# Patient Record
Sex: Female | Born: 1945 | Hispanic: No | Smoking: Current some day smoker
Health system: Southern US, Community
[De-identification: ages and names within clinical notes are randomized; demographics above are authoritative.]

## PROBLEM LIST (undated history)

## (undated) DIAGNOSIS — I10 Essential (primary) hypertension: Secondary | ICD-10-CM

## (undated) DIAGNOSIS — E785 Hyperlipidemia, unspecified: Secondary | ICD-10-CM

## (undated) DIAGNOSIS — E119 Type 2 diabetes mellitus without complications: Secondary | ICD-10-CM

---

## 1999-05-26 ENCOUNTER — Encounter: Payer: Self-pay | Admitting: Family Medicine

## 1999-05-26 ENCOUNTER — Ambulatory Visit (HOSPITAL_COMMUNITY): Admission: RE | Admit: 1999-05-26 | Discharge: 1999-05-26 | Payer: Self-pay | Admitting: Family Medicine

## 1999-05-31 ENCOUNTER — Encounter: Payer: Self-pay | Admitting: Family Medicine

## 1999-05-31 ENCOUNTER — Ambulatory Visit (HOSPITAL_COMMUNITY): Admission: RE | Admit: 1999-05-31 | Discharge: 1999-05-31 | Payer: Self-pay | Admitting: Family Medicine

## 2012-02-19 DIAGNOSIS — I10 Essential (primary) hypertension: Secondary | ICD-10-CM | POA: Diagnosis not present

## 2012-02-19 DIAGNOSIS — E782 Mixed hyperlipidemia: Secondary | ICD-10-CM | POA: Diagnosis not present

## 2012-02-19 DIAGNOSIS — Z79899 Other long term (current) drug therapy: Secondary | ICD-10-CM | POA: Diagnosis not present

## 2012-02-19 DIAGNOSIS — E1129 Type 2 diabetes mellitus with other diabetic kidney complication: Secondary | ICD-10-CM | POA: Diagnosis not present

## 2012-07-28 DIAGNOSIS — Z23 Encounter for immunization: Secondary | ICD-10-CM | POA: Diagnosis not present

## 2012-09-28 DIAGNOSIS — Z79899 Other long term (current) drug therapy: Secondary | ICD-10-CM | POA: Diagnosis not present

## 2012-09-28 DIAGNOSIS — M159 Polyosteoarthritis, unspecified: Secondary | ICD-10-CM | POA: Diagnosis not present

## 2012-09-28 DIAGNOSIS — F17200 Nicotine dependence, unspecified, uncomplicated: Secondary | ICD-10-CM | POA: Diagnosis not present

## 2012-09-28 DIAGNOSIS — E119 Type 2 diabetes mellitus without complications: Secondary | ICD-10-CM | POA: Diagnosis not present

## 2013-04-07 DIAGNOSIS — E119 Type 2 diabetes mellitus without complications: Secondary | ICD-10-CM | POA: Diagnosis not present

## 2013-04-12 DIAGNOSIS — E119 Type 2 diabetes mellitus without complications: Secondary | ICD-10-CM | POA: Diagnosis not present

## 2013-04-13 DIAGNOSIS — Z006 Encounter for examination for normal comparison and control in clinical research program: Secondary | ICD-10-CM | POA: Diagnosis not present

## 2013-04-13 DIAGNOSIS — E1129 Type 2 diabetes mellitus with other diabetic kidney complication: Secondary | ICD-10-CM | POA: Diagnosis not present

## 2013-07-10 DIAGNOSIS — E119 Type 2 diabetes mellitus without complications: Secondary | ICD-10-CM | POA: Diagnosis not present

## 2013-07-10 DIAGNOSIS — Z79899 Other long term (current) drug therapy: Secondary | ICD-10-CM | POA: Diagnosis not present

## 2013-07-17 DIAGNOSIS — Z23 Encounter for immunization: Secondary | ICD-10-CM | POA: Diagnosis not present

## 2013-07-17 DIAGNOSIS — F17200 Nicotine dependence, unspecified, uncomplicated: Secondary | ICD-10-CM | POA: Diagnosis not present

## 2013-07-17 DIAGNOSIS — E119 Type 2 diabetes mellitus without complications: Secondary | ICD-10-CM | POA: Diagnosis not present

## 2014-01-11 DIAGNOSIS — Z79899 Other long term (current) drug therapy: Secondary | ICD-10-CM | POA: Diagnosis not present

## 2014-01-11 DIAGNOSIS — F17200 Nicotine dependence, unspecified, uncomplicated: Secondary | ICD-10-CM | POA: Diagnosis not present

## 2014-01-11 DIAGNOSIS — E119 Type 2 diabetes mellitus without complications: Secondary | ICD-10-CM | POA: Diagnosis not present

## 2014-01-11 DIAGNOSIS — E782 Mixed hyperlipidemia: Secondary | ICD-10-CM | POA: Diagnosis not present

## 2014-06-01 DIAGNOSIS — Z79899 Other long term (current) drug therapy: Secondary | ICD-10-CM | POA: Diagnosis not present

## 2014-06-01 DIAGNOSIS — Z0000 Encounter for general adult medical examination without abnormal findings: Secondary | ICD-10-CM | POA: Diagnosis not present

## 2014-06-01 DIAGNOSIS — E119 Type 2 diabetes mellitus without complications: Secondary | ICD-10-CM | POA: Diagnosis not present

## 2014-06-01 DIAGNOSIS — E782 Mixed hyperlipidemia: Secondary | ICD-10-CM | POA: Diagnosis not present

## 2014-06-01 DIAGNOSIS — F17200 Nicotine dependence, unspecified, uncomplicated: Secondary | ICD-10-CM | POA: Diagnosis not present

## 2014-06-01 DIAGNOSIS — Z760 Encounter for issue of repeat prescription: Secondary | ICD-10-CM | POA: Diagnosis not present

## 2014-07-27 DIAGNOSIS — Z23 Encounter for immunization: Secondary | ICD-10-CM | POA: Diagnosis not present

## 2015-03-28 DIAGNOSIS — N183 Chronic kidney disease, stage 3 (moderate): Secondary | ICD-10-CM | POA: Diagnosis not present

## 2015-03-28 DIAGNOSIS — Z6841 Body Mass Index (BMI) 40.0 and over, adult: Secondary | ICD-10-CM | POA: Diagnosis not present

## 2015-03-28 DIAGNOSIS — Z79899 Other long term (current) drug therapy: Secondary | ICD-10-CM | POA: Diagnosis not present

## 2015-03-28 DIAGNOSIS — Z23 Encounter for immunization: Secondary | ICD-10-CM | POA: Diagnosis not present

## 2015-03-28 DIAGNOSIS — E1122 Type 2 diabetes mellitus with diabetic chronic kidney disease: Secondary | ICD-10-CM | POA: Diagnosis not present

## 2015-03-28 DIAGNOSIS — M8580 Other specified disorders of bone density and structure, unspecified site: Secondary | ICD-10-CM | POA: Diagnosis not present

## 2015-03-28 DIAGNOSIS — Z794 Long term (current) use of insulin: Secondary | ICD-10-CM | POA: Diagnosis not present

## 2015-05-29 DIAGNOSIS — E161 Other hypoglycemia: Secondary | ICD-10-CM | POA: Diagnosis not present

## 2015-09-16 DIAGNOSIS — Z23 Encounter for immunization: Secondary | ICD-10-CM | POA: Diagnosis not present

## 2016-08-03 DIAGNOSIS — Z0000 Encounter for general adult medical examination without abnormal findings: Secondary | ICD-10-CM | POA: Diagnosis not present

## 2016-08-03 DIAGNOSIS — Z23 Encounter for immunization: Secondary | ICD-10-CM | POA: Diagnosis not present

## 2016-08-03 DIAGNOSIS — Z794 Long term (current) use of insulin: Secondary | ICD-10-CM | POA: Diagnosis not present

## 2016-08-03 DIAGNOSIS — D638 Anemia in other chronic diseases classified elsewhere: Secondary | ICD-10-CM | POA: Diagnosis not present

## 2016-08-03 DIAGNOSIS — E1122 Type 2 diabetes mellitus with diabetic chronic kidney disease: Secondary | ICD-10-CM | POA: Diagnosis not present

## 2016-08-03 DIAGNOSIS — N183 Chronic kidney disease, stage 3 (moderate): Secondary | ICD-10-CM | POA: Diagnosis not present

## 2017-08-14 DIAGNOSIS — Z23 Encounter for immunization: Secondary | ICD-10-CM | POA: Diagnosis not present

## 2017-11-17 DIAGNOSIS — Z0000 Encounter for general adult medical examination without abnormal findings: Secondary | ICD-10-CM | POA: Diagnosis not present

## 2017-11-17 DIAGNOSIS — N183 Chronic kidney disease, stage 3 (moderate): Secondary | ICD-10-CM | POA: Diagnosis not present

## 2017-11-17 DIAGNOSIS — E1122 Type 2 diabetes mellitus with diabetic chronic kidney disease: Secondary | ICD-10-CM | POA: Diagnosis not present

## 2017-11-17 DIAGNOSIS — Z794 Long term (current) use of insulin: Secondary | ICD-10-CM | POA: Diagnosis not present

## 2018-05-23 DIAGNOSIS — Z1331 Encounter for screening for depression: Secondary | ICD-10-CM | POA: Diagnosis not present

## 2018-05-23 DIAGNOSIS — Z794 Long term (current) use of insulin: Secondary | ICD-10-CM | POA: Diagnosis not present

## 2018-05-23 DIAGNOSIS — E1122 Type 2 diabetes mellitus with diabetic chronic kidney disease: Secondary | ICD-10-CM | POA: Diagnosis not present

## 2018-05-23 DIAGNOSIS — N183 Chronic kidney disease, stage 3 (moderate): Secondary | ICD-10-CM | POA: Diagnosis not present

## 2018-06-13 DIAGNOSIS — E1129 Type 2 diabetes mellitus with other diabetic kidney complication: Secondary | ICD-10-CM | POA: Diagnosis not present

## 2018-06-24 DIAGNOSIS — E1129 Type 2 diabetes mellitus with other diabetic kidney complication: Secondary | ICD-10-CM | POA: Diagnosis not present

## 2018-07-12 DIAGNOSIS — Z23 Encounter for immunization: Secondary | ICD-10-CM | POA: Diagnosis not present

## 2019-02-15 DIAGNOSIS — Z79899 Other long term (current) drug therapy: Secondary | ICD-10-CM | POA: Diagnosis not present

## 2019-02-15 DIAGNOSIS — Z6837 Body mass index (BMI) 37.0-37.9, adult: Secondary | ICD-10-CM | POA: Diagnosis not present

## 2019-02-15 DIAGNOSIS — Z0000 Encounter for general adult medical examination without abnormal findings: Secondary | ICD-10-CM | POA: Diagnosis not present

## 2019-02-15 DIAGNOSIS — Z9181 History of falling: Secondary | ICD-10-CM | POA: Diagnosis not present

## 2019-02-15 DIAGNOSIS — E1122 Type 2 diabetes mellitus with diabetic chronic kidney disease: Secondary | ICD-10-CM | POA: Diagnosis not present

## 2019-02-15 DIAGNOSIS — Z1331 Encounter for screening for depression: Secondary | ICD-10-CM | POA: Diagnosis not present

## 2019-02-15 DIAGNOSIS — E782 Mixed hyperlipidemia: Secondary | ICD-10-CM | POA: Diagnosis not present

## 2019-08-02 DIAGNOSIS — Z23 Encounter for immunization: Secondary | ICD-10-CM | POA: Diagnosis not present

## 2020-08-21 DIAGNOSIS — Z13820 Encounter for screening for osteoporosis: Secondary | ICD-10-CM | POA: Diagnosis not present

## 2020-08-21 DIAGNOSIS — N183 Chronic kidney disease, stage 3 unspecified: Secondary | ICD-10-CM | POA: Diagnosis not present

## 2020-08-21 DIAGNOSIS — E782 Mixed hyperlipidemia: Secondary | ICD-10-CM | POA: Diagnosis not present

## 2020-08-21 DIAGNOSIS — Z79899 Other long term (current) drug therapy: Secondary | ICD-10-CM | POA: Diagnosis not present

## 2020-08-21 DIAGNOSIS — Z780 Asymptomatic menopausal state: Secondary | ICD-10-CM | POA: Diagnosis not present

## 2020-08-21 DIAGNOSIS — Z0000 Encounter for general adult medical examination without abnormal findings: Secondary | ICD-10-CM | POA: Diagnosis not present

## 2020-08-21 DIAGNOSIS — Z23 Encounter for immunization: Secondary | ICD-10-CM | POA: Diagnosis not present

## 2020-08-21 DIAGNOSIS — I10 Essential (primary) hypertension: Secondary | ICD-10-CM | POA: Diagnosis not present

## 2020-08-21 DIAGNOSIS — Z794 Long term (current) use of insulin: Secondary | ICD-10-CM | POA: Diagnosis not present

## 2020-08-21 DIAGNOSIS — E1122 Type 2 diabetes mellitus with diabetic chronic kidney disease: Secondary | ICD-10-CM | POA: Diagnosis not present

## 2020-09-10 DIAGNOSIS — Z1231 Encounter for screening mammogram for malignant neoplasm of breast: Secondary | ICD-10-CM | POA: Diagnosis not present

## 2021-08-18 ENCOUNTER — Encounter (HOSPITAL_BASED_OUTPATIENT_CLINIC_OR_DEPARTMENT_OTHER): Payer: Self-pay

## 2021-08-18 ENCOUNTER — Ambulatory Visit (HOSPITAL_BASED_OUTPATIENT_CLINIC_OR_DEPARTMENT_OTHER)
Admission: RE | Admit: 2021-08-18 | Discharge: 2021-08-18 | Disposition: A | Discharge status: Home / Self Care | Payer: Medicare HMO | Source: Ambulatory Visit | Attending: Nurse Practitioner | Admitting: Nurse Practitioner

## 2021-08-18 ENCOUNTER — Other Ambulatory Visit (HOSPITAL_BASED_OUTPATIENT_CLINIC_OR_DEPARTMENT_OTHER): Payer: Self-pay | Admitting: Nurse Practitioner

## 2021-08-18 ENCOUNTER — Other Ambulatory Visit: Payer: Self-pay

## 2021-08-18 ENCOUNTER — Inpatient Hospital Stay (HOSPITAL_BASED_OUTPATIENT_CLINIC_OR_DEPARTMENT_OTHER)
Admission: EM | Admit: 2021-08-18 | Discharge: 2021-08-20 | DRG: 065 | Disposition: A | Discharge status: Home Health | Payer: Medicare HMO | Attending: Neurology | Admitting: Neurology

## 2021-08-18 ENCOUNTER — Encounter (HOSPITAL_BASED_OUTPATIENT_CLINIC_OR_DEPARTMENT_OTHER): Payer: Self-pay | Admitting: Emergency Medicine

## 2021-08-18 DIAGNOSIS — E1159 Type 2 diabetes mellitus with other circulatory complications: Secondary | ICD-10-CM | POA: Diagnosis not present

## 2021-08-18 DIAGNOSIS — E669 Obesity, unspecified: Secondary | ICD-10-CM | POA: Diagnosis present

## 2021-08-18 DIAGNOSIS — R55 Syncope and collapse: Secondary | ICD-10-CM | POA: Diagnosis not present

## 2021-08-18 DIAGNOSIS — Z20822 Contact with and (suspected) exposure to covid-19: Secondary | ICD-10-CM | POA: Diagnosis present

## 2021-08-18 DIAGNOSIS — E1122 Type 2 diabetes mellitus with diabetic chronic kidney disease: Secondary | ICD-10-CM | POA: Diagnosis present

## 2021-08-18 DIAGNOSIS — S0083XA Contusion of other part of head, initial encounter: Secondary | ICD-10-CM

## 2021-08-18 DIAGNOSIS — I10 Essential (primary) hypertension: Secondary | ICD-10-CM | POA: Diagnosis not present

## 2021-08-18 DIAGNOSIS — G8191 Hemiplegia, unspecified affecting right dominant side: Secondary | ICD-10-CM | POA: Diagnosis not present

## 2021-08-18 DIAGNOSIS — Z794 Long term (current) use of insulin: Secondary | ICD-10-CM | POA: Diagnosis not present

## 2021-08-18 DIAGNOSIS — I6381 Other cerebral infarction due to occlusion or stenosis of small artery: Secondary | ICD-10-CM | POA: Diagnosis not present

## 2021-08-18 DIAGNOSIS — I161 Hypertensive emergency: Secondary | ICD-10-CM | POA: Diagnosis present

## 2021-08-18 DIAGNOSIS — I619 Nontraumatic intracerebral hemorrhage, unspecified: Secondary | ICD-10-CM | POA: Diagnosis not present

## 2021-08-18 DIAGNOSIS — N1832 Chronic kidney disease, stage 3b: Secondary | ICD-10-CM | POA: Diagnosis present

## 2021-08-18 DIAGNOSIS — N183 Chronic kidney disease, stage 3 unspecified: Secondary | ICD-10-CM | POA: Diagnosis not present

## 2021-08-18 DIAGNOSIS — I612 Nontraumatic intracerebral hemorrhage in hemisphere, unspecified: Secondary | ICD-10-CM | POA: Diagnosis not present

## 2021-08-18 DIAGNOSIS — W19XXXA Unspecified fall, initial encounter: Secondary | ICD-10-CM | POA: Diagnosis not present

## 2021-08-18 DIAGNOSIS — I720 Aneurysm of carotid artery: Secondary | ICD-10-CM | POA: Diagnosis not present

## 2021-08-18 DIAGNOSIS — Y92009 Unspecified place in unspecified non-institutional (private) residence as the place of occurrence of the external cause: Secondary | ICD-10-CM

## 2021-08-18 DIAGNOSIS — I6389 Other cerebral infarction: Secondary | ICD-10-CM | POA: Diagnosis not present

## 2021-08-18 DIAGNOSIS — I610 Nontraumatic intracerebral hemorrhage in hemisphere, subcortical: Secondary | ICD-10-CM | POA: Diagnosis not present

## 2021-08-18 DIAGNOSIS — Z6833 Body mass index (BMI) 33.0-33.9, adult: Secondary | ICD-10-CM

## 2021-08-18 DIAGNOSIS — E785 Hyperlipidemia, unspecified: Secondary | ICD-10-CM | POA: Diagnosis present

## 2021-08-18 DIAGNOSIS — S0993XA Unspecified injury of face, initial encounter: Secondary | ICD-10-CM | POA: Diagnosis not present

## 2021-08-18 DIAGNOSIS — S06360A Traumatic hemorrhage of cerebrum, unspecified, without loss of consciousness, initial encounter: Secondary | ICD-10-CM | POA: Diagnosis not present

## 2021-08-18 DIAGNOSIS — R29704 NIHSS score 4: Secondary | ICD-10-CM | POA: Diagnosis present

## 2021-08-18 DIAGNOSIS — F17210 Nicotine dependence, cigarettes, uncomplicated: Secondary | ICD-10-CM | POA: Diagnosis present

## 2021-08-18 DIAGNOSIS — N179 Acute kidney failure, unspecified: Secondary | ICD-10-CM | POA: Diagnosis not present

## 2021-08-18 DIAGNOSIS — I129 Hypertensive chronic kidney disease with stage 1 through stage 4 chronic kidney disease, or unspecified chronic kidney disease: Secondary | ICD-10-CM | POA: Diagnosis not present

## 2021-08-18 DIAGNOSIS — E119 Type 2 diabetes mellitus without complications: Secondary | ICD-10-CM

## 2021-08-18 DIAGNOSIS — R531 Weakness: Secondary | ICD-10-CM | POA: Diagnosis not present

## 2021-08-18 HISTORY — DX: Type 2 diabetes mellitus without complications: E11.9

## 2021-08-18 HISTORY — DX: Essential (primary) hypertension: I10

## 2021-08-18 HISTORY — DX: Hyperlipidemia, unspecified: E78.5

## 2021-08-18 LAB — PROTIME-INR
INR: 1 (ref 0.8–1.2)
Prothrombin Time: 13 seconds (ref 11.4–15.2)

## 2021-08-18 LAB — DIFFERENTIAL
Abs Immature Granulocytes: 0.09 10*3/uL — ABNORMAL HIGH (ref 0.00–0.07)
Basophils Absolute: 0.1 10*3/uL (ref 0.0–0.1)
Basophils Relative: 0 %
Eosinophils Absolute: 0.1 10*3/uL (ref 0.0–0.5)
Eosinophils Relative: 0 %
Immature Granulocytes: 1 %
Lymphocytes Relative: 14 %
Lymphs Abs: 1.8 10*3/uL (ref 0.7–4.0)
Monocytes Absolute: 0.9 10*3/uL (ref 0.1–1.0)
Monocytes Relative: 7 %
Neutro Abs: 10.3 10*3/uL — ABNORMAL HIGH (ref 1.7–7.7)
Neutrophils Relative %: 78 %

## 2021-08-18 LAB — CBC
HCT: 30.3 % — ABNORMAL LOW (ref 36.0–46.0)
Hemoglobin: 9.4 g/dL — ABNORMAL LOW (ref 12.0–15.0)
MCH: 26.9 pg (ref 26.0–34.0)
MCHC: 31 g/dL (ref 30.0–36.0)
MCV: 86.8 fL (ref 80.0–100.0)
Platelets: 321 10*3/uL (ref 150–400)
RBC: 3.49 MIL/uL — ABNORMAL LOW (ref 3.87–5.11)
RDW: 15.2 % (ref 11.5–15.5)
WBC: 13.2 10*3/uL — ABNORMAL HIGH (ref 4.0–10.5)
nRBC: 0 % (ref 0.0–0.2)

## 2021-08-18 LAB — COMPREHENSIVE METABOLIC PANEL
ALT: 17 U/L (ref 0–44)
AST: 16 U/L (ref 15–41)
Albumin: 4.1 g/dL (ref 3.5–5.0)
Alkaline Phosphatase: 105 U/L (ref 38–126)
Anion gap: 13 (ref 5–15)
BUN: 44 mg/dL — ABNORMAL HIGH (ref 8–23)
CO2: 20 mmol/L — ABNORMAL LOW (ref 22–32)
Calcium: 9.2 mg/dL (ref 8.9–10.3)
Chloride: 109 mmol/L (ref 98–111)
Creatinine, Ser: 2.6 mg/dL — ABNORMAL HIGH (ref 0.44–1.00)
GFR, Estimated: 19 mL/min — ABNORMAL LOW (ref >60)
Glucose, Bld: 239 mg/dL — ABNORMAL HIGH (ref 70–99)
Potassium: 4.2 mmol/L (ref 3.5–5.1)
Sodium: 142 mmol/L (ref 135–145)
Total Bilirubin: 0.3 mg/dL (ref 0.3–1.2)
Total Protein: 7.9 g/dL (ref 6.5–8.1)

## 2021-08-18 LAB — HEMOGLOBIN A1C
Hgb A1c MFr Bld: 5.4 % (ref 4.8–5.6)
Mean Plasma Glucose: 108.28 mg/dL

## 2021-08-18 LAB — RESP PANEL BY RT-PCR (FLU A&B, COVID) ARPGX2
Influenza A by PCR: NEGATIVE
Influenza B by PCR: NEGATIVE
SARS Coronavirus 2 by RT PCR: NEGATIVE

## 2021-08-18 LAB — APTT: aPTT: 31 seconds (ref 24–36)

## 2021-08-18 LAB — GLUCOSE, CAPILLARY: Glucose-Capillary: 220 mg/dL — ABNORMAL HIGH (ref 70–99)

## 2021-08-18 MED ORDER — ACETAMINOPHEN 160 MG/5ML PO SOLN: 650.0000 mg | ORAL | Status: DC | PRN

## 2021-08-18 MED ORDER — ACETAMINOPHEN 325 MG PO TABS: 650.0000 mg | ORAL_TABLET | ORAL | Status: DC | PRN

## 2021-08-18 MED ORDER — SENNOSIDES-DOCUSATE SODIUM 8.6-50 MG PO TABS
1.0000 | ORAL_TABLET | Freq: Two times a day (BID) | ORAL | Status: DC
  Administered 2021-08-18: 1 via ORAL
  Filled 2021-08-18 (×2): qty 1

## 2021-08-18 MED ORDER — INSULIN ASPART 100 UNIT/ML IJ SOLN
0.0000 [IU] | Freq: Three times a day (TID) | INTRAMUSCULAR | Status: DC
  Administered 2021-08-19: 8 [IU] via SUBCUTANEOUS
  Administered 2021-08-19: 2 [IU] via SUBCUTANEOUS
  Administered 2021-08-19 – 2021-08-20 (×2): 5 [IU] via SUBCUTANEOUS
  Administered 2021-08-20: 2 [IU] via SUBCUTANEOUS

## 2021-08-18 MED ORDER — PANTOPRAZOLE SODIUM 40 MG IV SOLR
40.0000 mg | Freq: Every day | INTRAVENOUS | Status: DC
  Administered 2021-08-18: 40 mg via INTRAVENOUS
  Filled 2021-08-18: qty 40

## 2021-08-18 MED ORDER — SODIUM CHLORIDE 0.9% FLUSH
3.0000 mL | Freq: Once | INTRAVENOUS | Status: DC
  Filled 2021-08-18: qty 3

## 2021-08-18 MED ORDER — NICARDIPINE HCL IN NACL 20-0.86 MG/200ML-% IV SOLN: 0.0000 mg/h | INTRAVENOUS | Status: DC | PRN

## 2021-08-18 MED ORDER — ACETAMINOPHEN 650 MG RE SUPP: 650.0000 mg | RECTAL | Status: DC | PRN

## 2021-08-18 MED ORDER — CLEVIDIPINE BUTYRATE 0.5 MG/ML IV EMUL
0.0000 mg/h | INTRAVENOUS | Status: DC
  Administered 2021-08-18: 1 mg/h via INTRAVENOUS
  Administered 2021-08-18 – 2021-08-19 (×2): 10 mg/h via INTRAVENOUS
  Filled 2021-08-18 (×3): qty 100

## 2021-08-18 MED ORDER — STROKE: EARLY STAGES OF RECOVERY BOOK: Freq: Once | Status: DC

## 2021-08-18 NOTE — ED Provider Notes (Signed)
[facility] EMERGENCY DEPT Provider Note   CSN: [id number] Arrival date & time: 08/18/21  1543     History Chief Complaint  Patient presents with   Dominique Kirk is a 75 y.o. female.  She has a history of diabetes hyperlipidemia hypertension but is not on any blood thinners.  She had a fall 4 days ago at home landing on her face.  Her daughter was there and heard the fall but did not know why it happened.  Since then she has been moving a little more slowly, possibly some visual difficulty in her left eye.  They talk to the primary care doctor today who recommended she come here to get a CT.  CT was positive for intraparenchymal hemorrhage, no facial fractures.  Patient denies any headache numbness weakness blurry vision double vision difficulty with balance.  Blood pressure elevated here.  Daughter says this happens sometimes when she is anxious.  The history is provided by the patient and a relative.  Fall This is a new problem. The current episode started more than 2 days ago. The problem has been gradually improving. Pertinent negatives include no chest pain, no abdominal pain, no headaches and no shortness of breath. Nothing aggravates the symptoms. Nothing relieves the symptoms. She has tried rest for the symptoms. The treatment provided mild relief.      Past Medical History:  Diagnosis Date   Diabetes mellitus without complication ([place])    Hyperlipidemia    Hypertension     There are no problems to display for this patient.   History reviewed. No pertinent surgical history.   OB History   No obstetric history on file.     History reviewed. No pertinent family history.  Social History   Tobacco Use   Smoking status: Every Day    Types: Cigarettes   Smokeless tobacco: Never    Home Medications Prior to Admission medications   Not on File    Allergies    Patient has no known allergies.  Review of Systems   Review of Systems   Constitutional:  Negative for fever.  HENT:  Positive for facial swelling. Negative for sore throat.   Eyes:  Positive for visual disturbance.  Respiratory:  Negative for shortness of breath.   Cardiovascular:  Negative for chest pain.  Gastrointestinal:  Negative for abdominal pain.  Genitourinary:  Negative for dysuria.  Musculoskeletal:  Negative for neck pain.  Skin:  Positive for wound. Negative for rash.  Neurological:  Negative for headaches.   Physical Exam Updated Vital Signs BP (!) 179/46 (BP Location: Left Arm)   Pulse 72   Temp 98.4 F (36.9 C) (Temporal)   Resp 15   Ht 4\' 11"  (1.499 m)   Wt 75.3 kg   SpO2 99%   BMI 33.53 kg/m   Physical Exam Vitals and nursing note reviewed.  Constitutional:      General: She is not in acute distress.    Appearance: Normal appearance. She is well-developed.  HENT:     Head: Normocephalic.     Comments: She has some bruising under her right eye and some swelling of her eyelid.  She also has some swelling over her right cheek and swelling of her lower lip with a small wound. Eyes:     Extraocular Movements: Extraocular movements intact.     Conjunctiva/sclera: Conjunctivae normal.     Pupils: Pupils are equal, round, and reactive to light.  Cardiovascular:  Rate and Rhythm: Normal rate and regular rhythm.     Heart sounds: No murmur heard. Pulmonary:     Effort: Pulmonary effort is normal. No respiratory distress.     Breath sounds: Normal breath sounds.  Abdominal:     Palpations: Abdomen is soft.     Tenderness: There is no abdominal tenderness. There is no guarding or rebound.  Musculoskeletal:        General: No deformity or signs of injury. Normal range of motion.     Cervical back: Neck supple.  Skin:    General: Skin is warm and dry.  Neurological:     General: No focal deficit present.     Mental Status: She is alert.     Cranial Nerves: No cranial nerve deficit.     Sensory: No sensory deficit.      Motor: No weakness.     Gait: Gait normal.    ED Results / Procedures / Treatments   Labs (all labs ordered are listed, but only abnormal results are displayed) Labs Reviewed  CBC - Abnormal; Notable for the following components:      Result Value   WBC 13.2 (*)    RBC 3.49 (*)    Hemoglobin 9.4 (*)    HCT 30.3 (*)    All other components within normal limits  DIFFERENTIAL - Abnormal; Notable for the following components:   Neutro Abs 10.3 (*)    Abs Immature Granulocytes 0.09 (*)    All other components within normal limits  COMPREHENSIVE METABOLIC PANEL - Abnormal; Notable for the following components:   CO2 20 (*)    Glucose, Bld 239 (*)    BUN 44 (*)    Creatinine, Ser 2.60 (*)    GFR, Estimated 19 (*)    All other components within normal limits  GLUCOSE, CAPILLARY - Abnormal; Notable for the following components:   Glucose-Capillary 220 (*)    All other components within normal limits  GLUCOSE, CAPILLARY - Abnormal; Notable for the following components:   Glucose-Capillary 124 (*)    All other components within normal limits  RESP PANEL BY RT-PCR (FLU A&B, COVID) ARPGX2  MRSA NEXT GEN BY PCR, NASAL  PROTIME-INR  APTT  HEMOGLOBIN A1C  CBG MONITORING, ED    EKG None  Radiology CT HEAD WO CONTRAST (5MM)  Result Date: 08/18/2021 CLINICAL DATA:  [place] 3 days ago with trauma to the head and face. EXAM: CT HEAD WITHOUT CONTRAST TECHNIQUE: Contiguous axial images were obtained from the base of the skull through the vertex without intravenous contrast. COMPARISON:  None. FINDINGS: Brain: No focal abnormality affects the brainstem or cerebellum. There is intraparenchymal hemorrhage within the left basal ganglia with mild surrounding edema. Findings could relate to a primary hypertensive hemorrhage or hemorrhagic infarction. No significant mass effect. Mild chronic small-vessel ischemic changes otherwise affect the cerebral hemispheric white matter. No hydrocephalus or  extra-axial collection. Vascular: There is atherosclerotic calcification of the major vessels at the base of the brain. Skull: Negative Sinuses/Orbits: Clear/normal Other: None IMPRESSION: Acute to early subacute hemorrhage in the left basal ganglia which could be a hypertensive hemorrhage or hemorrhagic infarction. Mild surrounding edema. Mild swelling but no brain shift. Chronic small-vessel ischemic changes otherwise affect the cerebral hemispheric white matter. Call report in progress. Electronically Signed   By: Nelson Chimes M.D.   On: 08/18/2021 15:02   CT MAXILLOFACIAL WO CONTRAST  Result Date: 08/18/2021 CLINICAL DATA:  [place] 3 days ago with trauma to  the right side of the face. EXAM: CT MAXILLOFACIAL WITHOUT CONTRAST TECHNIQUE: Multidetector CT imaging of the maxillofacial structures was performed. Multiplanar CT image reconstructions were also generated. COMPARISON:  None. FINDINGS: Osseous: No facial fracture. The patient has chronic dental and periodontal disease. Orbits: No intraorbital injury. Sinuses: No fluid in the sinuses. Soft tissues: Soft tissue bruising in the superficial soft tissues of the right face and cheek. Limited intracranial: See results of head CT. IMPRESSION: No facial fracture. Soft tissue hematoma of the right face and cheek. Advanced dental and periodontal disease. Electronically Signed   By: Nelson Chimes M.D.   On: 08/18/2021 15:04    Procedures .Critical Care Performed by: Hayden Rasmussen, MD Authorized by: Hayden Rasmussen, MD   Critical care provider statement:    Critical care time (minutes):  45   Critical care time was exclusive of:  Separately billable procedures and treating other patients   Critical care was necessary to treat or prevent imminent or life-threatening deterioration of the following conditions:  CNS failure or compromise   Critical care was time spent personally by me on the following activities:  Development of treatment plan with patient  or surrogate, discussions with consultants, evaluation of patient's response to treatment, examination of patient, obtaining history from patient or surrogate, ordering and performing treatments and interventions, ordering and review of laboratory studies, ordering and review of radiographic studies, pulse oximetry and re-evaluation of patient's condition   I assumed direction of critical care for this patient from another provider in my specialty: no     Medications Ordered in ED Medications  sodium chloride flush (NS) 0.9 % injection 3 mL (has no administration in time range)  clevidipine (CLEVIPREX) infusion 0.5 mg/mL (4 mg/hr Intravenous Infusion Verify 08/19/21 0800)   stroke: mapping our early stages of recovery book (has no administration in time range)  acetaminophen (TYLENOL) tablet 650 mg (has no administration in time range)    Or  acetaminophen (TYLENOL) 160 MG/5ML solution 650 mg (has no administration in time range)    Or  acetaminophen (TYLENOL) suppository 650 mg (has no administration in time range)  senna-docusate (Senokot-S) tablet 1 tablet (1 tablet Oral Not Given 08/19/21 0846)  pantoprazole (PROTONIX) injection 40 mg (40 mg Intravenous Given 08/18/21 2130)  insulin aspart (novoLOG) injection 0-15 Units (2 Units Subcutaneous Given 08/19/21 0844)  Chlorhexidine Gluconate Cloth 2 % PADS 6 each (has no administration in time range)  0.9 %  sodium chloride infusion ( Intravenous New Bag/Given 08/19/21 0845)  perflutren lipid microspheres (DEFINITY) IV suspension (2 mLs Intravenous Given 08/19/21 0945)    ED Course  I have reviewed the triage vital signs and the nursing notes.  Pertinent labs & imaging results that were available during my care of the patient were reviewed by me and considered in my medical decision making (see chart for details).  Clinical Course as of 08/19/21 1022  Mon Aug 18, 2021  1643 Discussed with Dr. Cheral Marker neuro hospitalist.  He is recommended the  patient be admitted to 4 N. ICU at [facility] under Dr. Rory Percy.  Cleviprex for blood pressure to be between 400 and 867 systolic. [MB]  1727 EKG showing sinus rhythm rate of 76 PAC short PR nonspecific T wave abnormalities no priors to compare with. [MB]    Clinical Course User Index [MB] Hayden Rasmussen, MD   MDM Rules/Calculators/A&P  This patient complains of fall abnormal head CT elevated blood pressure; this involves an extensive number of treatment Options and is a complaint that carries with it a high risk of complications and Morbidity. The differential includes stroke, bleed, hypertensive emergency  I ordered, reviewed and interpreted labs, which included CBC with elevated white count low hemoglobin unclear priors, chemistries with elevated glucose elevated BUN/creatinine unclear priors, COVID and flu negative, INR normal I ordered medication IV cleviprex for patient's elevated blood pressure I ordered imaging studies which included CT head and maxillofacial and I independently    visualized and interpreted imaging which showed left basal ganglia hemorrhagic event Additional history obtained from patient's daughter Previous records obtained and reviewed in epic no recent admissions I consulted Dr. Cheral Marker neuro hospitalist and discussed lab and imaging findings  Critical Interventions: Initiation of IV medication for blood pressure control in the setting of intracranial hemorrhage  After the interventions stated above, I reevaluated the patient and found patient be neuro intact and blood pressure improving.  Patient being transported by [facility] to ICU bed.   Final Clinical Impression(s) / ED Diagnoses Final diagnoses:  Nontraumatic intracerebral hemorrhage, unspecified cerebral location, unspecified laterality [facility])  Hypertensive emergency    Rx / DC Orders ED Discharge Orders     None        Hayden Rasmussen, MD 08/19/21 1028

## 2021-08-18 NOTE — ED Triage Notes (Signed)
Pt arrives to ED with c/o of fall. Pt reports that she fell forward and hit her head on the floor. Pt reports that she feels lethargic, sleepy, and slow. She had an outpatient CT head ordered by her PCP that showed early subacute hemorrhage in L basil ganglia.

## 2021-08-18 NOTE — H&P (Signed)
Neurology H&P  CC: fall  History is obtained from:Patient, family  HPI: Leniyah Martell is a 75 y.o. female with a history of hypertension, hyperlipidemia, diabetes who presents after a fall on Friday.  Since that time, she has been having some generalized fatigue and moving slightly slower.  She did not notice any focal findings.  They called her PCP today who advised her to come get a CT scan in the emergency department, which was performed and demonstrates a basal ganglia hemorrhage.  She was hypertensive and therefore started on clevidipine and thus has been transferred to the neuro ICU for further management.   LKW: Friday tpa given?: No, [facility] IR Thrombectomy? No, [facility] NIHSS: 4   ROS: A complete ROS was performed and is negative except as noted in the HPI.  Past Medical History:  Diagnosis Date   Diabetes mellitus without complication ([place])    Hyperlipidemia    Hypertension      History reviewed. No pertinent family history.   Social History:  reports that she has been smoking cigarettes. She has never used smokeless tobacco. No history on file for alcohol use and drug use.   Prior to Admission medications   Not on File     Exam: Current vital signs: BP (!) 150/53   Pulse 87   Temp 98.4 F (36.9 C) (Temporal)   Resp (!) 21   Ht 4\' 11"  (1.499 m)   Wt 75.3 kg   SpO2 99%   BMI 33.53 kg/m    Physical Exam  Constitutional: Appears well-developed and well-nourished.  Psych: Affect appropriate to situation Eyes: No scleral injection HENT: She has some swelling on the right side of her face from where she fell, also a healing lip abrasion. Head: Normocephalic.  Cardiovascular: Normal rate and regular rhythm.  Respiratory: Effort normal and breath sounds normal to anterior ascultation GI: Soft.  No distension. There is no tenderness.  Skin: WDI  Neuro: Mental Status: Patient is awake, alert, oriented to person, place, month, year, and situation. Patient is  able to give a clear and coherent history. No signs of aphasia or neglect Cranial Nerves: II: Visual Fields are full. Pupils are equal, round, and reactive to light.   III,IV, VI: EOMI without ptosis or diplopia.  V: Facial sensation is symmetric to temperature VII: Facial movement is symmetric.  VIII: hearing is intact to voice X: Uvula is midline and palate elevates symmetrically XI: Shoulder shrug is symmetric. XII: tongue is midline without atrophy or fasciculations.  Motor: She has drift in bilateral lower extremities, but relatively symmetric, confrontational testing is relatively symmetric.  She has minimal drift in her right upper extremity, but the confrontational testing is 4/5 on the right and 5/5 on the left. Sensory: Sensation is symmetric to light touch and temperature in the arms and legs. 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  It is possible that this is a hemorrhagic infarct, but I suspect it is more likely hypertensive hemorrhage.  She is being admitted for blood pressure control and therapy evaluations.  Given that she is 4 days out, favor using a slightly more liberal blood pressure goal of 160.  Plan: 1) Admit to ICU 2) no antiplatelets or anticoagulants 3) blood pressure control with goal systolic 846 - 659 4) Frequent neuro checks 5) If symptoms worsen or there is decreased  mental status, repeat stat head CT 6) PT,OT,ST    This patient is critically ill and at significant risk of neurological worsening, death and care requires constant monitoring of vital signs, hemodynamics,respiratory and cardiac monitoring, neurological assessment, discussion with family, other specialists and medical decision making of high complexity. I spent 45 minutes of neurocritical care time  in the care of  this patient. This was time spent independent of any time provided by nurse practitioner or PA.  Roland Rack, MD [facility] [phone]  If 7pm- 7am, please page neurology on call as listed in [place].

## 2021-08-19 ENCOUNTER — Inpatient Hospital Stay (HOSPITAL_COMMUNITY): Payer: Medicare HMO

## 2021-08-19 DIAGNOSIS — I610 Nontraumatic intracerebral hemorrhage in hemisphere, subcortical: Principal | ICD-10-CM

## 2021-08-19 DIAGNOSIS — I161 Hypertensive emergency: Secondary | ICD-10-CM | POA: Diagnosis not present

## 2021-08-19 DIAGNOSIS — I6389 Other cerebral infarction: Secondary | ICD-10-CM

## 2021-08-19 LAB — ECHOCARDIOGRAM COMPLETE
AR max vel: 1.66 cm2
AV Area VTI: 1.81 cm2
AV Area mean vel: 1.83 cm2
AV Mean grad: 11 mmHg
AV Peak grad: 19.4 mmHg
Ao pk vel: 2.2 m/s
Area-P 1/2: 3.13 cm2
Calc EF: 68.8 %
Height: 59 in
S' Lateral: 2.56 cm
Single Plane A2C EF: 70 %
Single Plane A4C EF: 68.7 %
Weight: 2656 oz

## 2021-08-19 LAB — GLUCOSE, CAPILLARY
Glucose-Capillary: 124 mg/dL — ABNORMAL HIGH (ref 70–99)
Glucose-Capillary: 97 mg/dL (ref 70–99)
Glucose-Capillary: 255 mg/dL — ABNORMAL HIGH (ref 70–99)
Glucose-Capillary: 316 mg/dL — ABNORMAL HIGH (ref 70–99)

## 2021-08-19 MED ORDER — PANTOPRAZOLE SODIUM 40 MG PO TBEC
40.0000 mg | DELAYED_RELEASE_TABLET | Freq: Every day | ORAL | Status: DC
  Administered 2021-08-19: 40 mg via ORAL
  Filled 2021-08-19: qty 1

## 2021-08-19 MED ORDER — HYDRALAZINE HCL 20 MG/ML IJ SOLN
5.0000 mg | INTRAMUSCULAR | Status: DC | PRN
  Administered 2021-08-19: 20 mg via INTRAVENOUS
  Filled 2021-08-19: qty 1

## 2021-08-19 MED ORDER — HYDRALAZINE HCL 25 MG PO TABS
25.0000 mg | ORAL_TABLET | Freq: Three times a day (TID) | ORAL | Status: DC
  Administered 2021-08-19 – 2021-08-20 (×3): 25 mg via ORAL
  Filled 2021-08-19 (×3): qty 1

## 2021-08-19 MED ORDER — PERFLUTREN LIPID MICROSPHERE
1.0000 mL | INTRAVENOUS | Status: AC | PRN
  Administered 2021-08-19: 2 mL via INTRAVENOUS
  Filled 2021-08-19: qty 10

## 2021-08-19 MED ORDER — AMLODIPINE BESYLATE 10 MG PO TABS
10.0000 mg | ORAL_TABLET | Freq: Every day | ORAL | Status: DC
  Administered 2021-08-19 – 2021-08-20 (×2): 10 mg via ORAL
  Filled 2021-08-19 (×2): qty 1

## 2021-08-19 MED ORDER — AMLODIPINE BESYLATE 5 MG PO TABS: 5.0000 mg | ORAL_TABLET | Freq: Every day | ORAL | Status: DC

## 2021-08-19 MED ORDER — CHLORHEXIDINE GLUCONATE CLOTH 2 % EX PADS
6.0000 | MEDICATED_PAD | Freq: Every day | CUTANEOUS | Status: DC
  Administered 2021-08-19: 6 via TOPICAL

## 2021-08-19 MED ORDER — SODIUM CHLORIDE 0.9 % IV SOLN: INTRAVENOUS | Status: DC

## 2021-08-19 MED ORDER — WHITE PETROLATUM EX OINT
TOPICAL_OINTMENT | CUTANEOUS | Status: AC
  Filled 2021-08-19: qty 28.35

## 2021-08-19 NOTE — Progress Notes (Signed)
STROKE TEAM PROGRESS NOTE   SUBJECTIVE (INTERVAL HISTORY) Her daughter is at the bedside.  Overall her condition is stable. Pt sitting in chair with PT at the bedside. Pt still has right facial swelling after the fall, but daughter said it is already getting better. Pt otherwise no focal deficit. 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 MRI and MRA pending.   OBJECTIVE Temp:  [97.8 F (36.6 C)-98.7 F (37.1 C)] 98.4 F (36.9 C) (11/01 1200) Pulse Rate:  [64-101] 64 (11/01 1300) Resp:  [10-41] 10 (11/01 1300) BP: (111-179)/(38-140) 150/50 (11/01 1300) SpO2:  [89 %-100 %] 98 % (11/01 1300) Weight:  [75.3 kg] 75.3 kg (10/31 1558)  Recent Labs  Lab 08/18/21 2216 08/19/21 0810 08/19/21 1122  GLUCAP 220* 124* 97   Recent Labs  Lab 08/18/21 1614  NA 142  K 4.2  CL 109  CO2 20*  GLUCOSE 239*  BUN 44*  CREATININE 2.60*  CALCIUM 9.2   Recent Labs  Lab 08/18/21 1614  AST 16  ALT 17  ALKPHOS 105  BILITOT 0.3  PROT 7.9  ALBUMIN 4.1   Recent Labs  Lab 08/18/21 1614  WBC 13.2*  NEUTROABS 10.3*  HGB 9.4*  HCT 30.3*  MCV 86.8  PLT 321   No results for input(s): CKTOTAL, CKMB, CKMBINDEX, TROPONINI in the last 168 hours. Recent Labs    08/18/21 1614  LABPROT 13.0  INR 1.0   No results for input(s): COLORURINE, LABSPEC, PHURINE, GLUCOSEU, HGBUR, BILIRUBINUR, KETONESUR, PROTEINUR, UROBILINOGEN, NITRITE, LEUKOCYTESUR in the last 72 hours.  Invalid input(s): APPERANCEUR  No results found for: CHOL, TRIG, HDL, CHOLHDL, VLDL, LDLCALC Lab Results  Component Value Date   HGBA1C 5.4 08/18/2021   No results found for: LABOPIA, COCAINSCRNUR, LABBENZ, AMPHETMU, THCU, LABBARB  No results for input(s): ETH in the last 168 hours.  I have personally reviewed the radiological images below and agree with the radiology interpretations.  CT HEAD WO CONTRAST (5MM)  Result Date: 08/18/2021 CLINICAL DATA:  [place] 3 days ago with trauma to the head and face. EXAM: CT HEAD WITHOUT CONTRAST  TECHNIQUE: Contiguous axial images were obtained from the base of the skull through the vertex without intravenous contrast. COMPARISON:  None. FINDINGS: Brain: No focal abnormality affects the brainstem or cerebellum. There is intraparenchymal hemorrhage within the left basal ganglia with mild surrounding edema. Findings could relate to a primary hypertensive hemorrhage or hemorrhagic infarction. No significant mass effect. Mild chronic small-vessel ischemic changes otherwise affect the cerebral hemispheric white matter. No hydrocephalus or extra-axial collection. Vascular: There is atherosclerotic calcification of the major vessels at the base of the brain. Skull: Negative Sinuses/Orbits: Clear/normal Other: None IMPRESSION: Acute to early subacute hemorrhage in the left basal ganglia which could be a hypertensive hemorrhage or hemorrhagic infarction. Mild surrounding edema. Mild swelling but no brain shift. Chronic small-vessel ischemic changes otherwise affect the cerebral hemispheric white matter. Call report in progress. Electronically Signed   By: Nelson Chimes M.D.   On: 08/18/2021 15:02   VAS US CAROTID  Result Date: 08/19/2021 Carotid Arterial Duplex Study Patient Name:  Dominique Kirk  Date of Exam:   08/19/2021 Medical Rec #: [id number]      Accession #:    [id number] Date of Birth: Mar 02, 1946     Patient Gender: F Patient Age:   1 years Exam Location:  [facility] Procedure:      VAS US CAROTID Referring Phys: Rosalin Hawking --------------------------------------------------------------------------------  Indications:  CVA. Risk Factors:      Hypertension, hyperlipidemia, Diabetes. Limitations        Today's exam was limited due to patient positioning and the                    body habitus of the patient. Comparison Study:  no prior Performing Technologist: Archie Patten RVS  Examination Guidelines: A complete evaluation includes B-mode imaging, spectral Doppler, color Doppler, and power  Doppler as needed of all accessible portions of each vessel. Bilateral testing is considered an integral part of a complete examination. Limited examinations for reoccurring indications may be performed as noted.  Right Carotid Findings: +----------+--------+--------+--------+------------------+--------+           PSV cm/sEDV cm/sStenosisPlaque DescriptionComments +----------+--------+--------+--------+------------------+--------+ CCA Prox  81      9               heterogenous               +----------+--------+--------+--------+------------------+--------+ CCA Distal86      13              heterogenous               +----------+--------+--------+--------+------------------+--------+ ICA Prox  144     27      1-39%   heterogenous               +----------+--------+--------+--------+------------------+--------+ ICA Distal62      13                                         +----------+--------+--------+--------+------------------+--------+ ECA       93                                                 +----------+--------+--------+--------+------------------+--------+ +----------+--------+-------+--------+-------------------+           PSV cm/sEDV cmsDescribeArm Pressure (mmHG) +----------+--------+-------+--------+-------------------+ [id number]                                        +----------+--------+-------+--------+-------------------+ +---------+--------+--+--------+--+---------+ VertebralPSV cm/s64EDV cm/s14Antegrade +---------+--------+--+--------+--+---------+  Left Carotid Findings: +----------+--------+--------+--------+------------------+--------+           PSV cm/sEDV cm/sStenosisPlaque DescriptionComments +----------+--------+--------+--------+------------------+--------+ CCA Prox  137     16              heterogenous               +----------+--------+--------+--------+------------------+--------+ CCA Distal97      11               heterogenous               +----------+--------+--------+--------+------------------+--------+ ICA Prox  112     22      1-39%   heterogenous      tortuous +----------+--------+--------+--------+------------------+--------+ ICA Distal77      16                                         +----------+--------+--------+--------+------------------+--------+ ECA       93                                                 +----------+--------+--------+--------+------------------+--------+ +----------+--------+--------+--------+-------------------+  PSV cm/sEDV cm/sDescribeArm Pressure (mmHG) +----------+--------+--------+--------+-------------------+ [id number]                                         +----------+--------+--------+--------+-------------------+ +---------+--------+--+--------+--+---------+ VertebralPSV cm/s62EDV cm/s17Antegrade +---------+--------+--+--------+--+---------+      Preliminary    CT MAXILLOFACIAL WO CONTRAST  Result Date: 08/18/2021 CLINICAL DATA:  [place] 3 days ago with trauma to the right side of the face. EXAM: CT MAXILLOFACIAL WITHOUT CONTRAST TECHNIQUE: Multidetector CT imaging of the maxillofacial structures was performed. Multiplanar CT image reconstructions were also generated. COMPARISON:  None. FINDINGS: Osseous: No facial fracture. The patient has chronic dental and periodontal disease. Orbits: No intraorbital injury. Sinuses: No fluid in the sinuses. Soft tissues: Soft tissue bruising in the superficial soft tissues of the right face and cheek. Limited intracranial: See results of head CT. IMPRESSION: No facial fracture. Soft tissue hematoma of the right face and cheek. Advanced dental and periodontal disease. Electronically Signed   By: Nelson Chimes M.D.   On: 08/18/2021 15:04     PHYSICAL EXAM  Temp:  [97.8 F (36.6 C)-98.7 F (37.1 C)] 98.4 F (36.9 C) (11/01 1200) Pulse Rate:  [64-101] 64 (11/01 1300) Resp:   [10-41] 10 (11/01 1300) BP: (111-179)/(38-140) 150/50 (11/01 1300) SpO2:  [89 %-100 %] 98 % (11/01 1300) Weight:  [75.3 kg] 75.3 kg (10/31 1558)  General - Well nourished, well developed, in no apparent distress.  Ophthalmologic - fundi not visualized due to noncooperation.  Cardiovascular - Regular rhythm and rate.  Mental Status -  Level of arousal and orientation to time, place, and person were intact. Language including expression, naming, repetition, comprehension was assessed and found intact.  Cranial Nerves II - XII - II - Visual field intact OU. III, IV, VI - Extraocular movements intact. V - Facial sensation intact bilaterally. VII - Right facial swelling VIII - Hearing & vestibular intact bilaterally. X - Palate elevates symmetrically. XI - Chin turning & shoulder shrug intact bilaterally. XII - Tongue protrusion intact.  Motor Strength - The patient's strength was normal in all extremities and pronator drift was absent.  Bulk was normal and fasciculations were absent.   Motor Tone - Muscle tone was assessed at the neck and appendages and was normal.  Reflexes - The patient's reflexes were symmetrical in all extremities and she had no pathological reflexes.  Sensory - Light touch, temperature/pinprick were assessed and were symmetrical.    Coordination - The patient had normal movements in the hands and feet with no ataxia or dysmetria.  Tremor was absent.  Gait and Station - deferred.   ASSESSMENT/PLAN [name] is a 75 y.o. female with history of hypertension, hyperlipidemia, diabetes admitted for fall at home and fatigue with generalized weakness. No tPA given due to [place].    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 with hypertension CT head left anterior BG ICH MRI pending MRA  pending Carotid Doppler unremarkable 2D Echo EF 65 to 70% LDL pending HgbA1c 5.4 SCDs for VTE prophylaxis No antithrombotic prior to admission, now on  No antithrombotic due to [place] Ongoing aggressive stroke risk factor management Therapy recommendations: Home health PT Disposition: Pending  Diabetes HgbA1c 5.4 goal < 7.0 Controlled CBG monitoring SSI DM education and close PCP follow up  Hypertension Stable Off Cleviprex On lisinopril and hydralazine BP goal < 160 Long term BP goal normotensive  Hyperlipidemia Home meds: None LDL pending, goal < 70 Add statin at discharge  Other Stroke Risk Factors Advanced age Obesity, Body mass index is 33.53 kg/m.   Other Active Problems Fall at home -> right facial swelling  Hospital day # 1  This patient is critically ill due to left BG ICH, hypertensive emergency and at significant risk of neurological worsening, death form hematoma expansion, cerebral edema, brain herniation, hypertensive encephalopathy, seizure. This patient's care requires constant monitoring of vital signs, hemodynamics, respiratory and cardiac monitoring, review of multiple databases, neurological assessment, discussion with family, other specialists and medical decision making of high complexity. I spent 40 minutes of neurocritical care time in the care of this patient. I had long discussion with daughter at bedside, updated pt current condition, treatment plan and potential prognosis, and answered all the questions.  She expressed understanding and appreciation.     Rosalin Hawking, MD PhD Stroke Neurology 08/19/2021 2:04 PM    To contact Stroke Continuity provider, please refer to [email or web address] After hours, contact General Neurology

## 2021-08-19 NOTE — Evaluation (Signed)
Speech Language Pathology Evaluation Patient Details Name: Dominique Kirk MRN: [id number] DOB: [birth year] Today's Date: 08/19/2021 Time: 4944-9675 SLP Time Calculation (min) (ACUTE ONLY): 36 min  Problem List:  Patient Active Problem List   Diagnosis Date Noted   ICH (intracerebral hemorrhage) ([place]) 08/18/2021   Past Medical History:  Past Medical History:  Diagnosis Date   Diabetes mellitus without complication ([place])    Hyperlipidemia    Hypertension    Past Surgical History: History reviewed. No pertinent surgical history. HPI:  75 y.o. F with PMHx significant for HTN, HLD, DM presenting to [facility] after fall, pt referred for imaging by PCP. Imaging revealed basal ganglia hemorrhage.   Assessment / Plan / Recommendation Clinical Impression  Pt was seen for a speech language evaluation. Overall, pt exhibits impairments in memory, attention, orientation, awareness, and problem solving, Daughter reports these deficits are baseline and have been gradually developing over the past 6 months. Receptive/expressive language within functional limits and speech intelligibility 100% across all utterance lengths. Oral mechanism exam revealed mild right facial asymmetry and reduced ROM alongside right lingual weakness. Pt oriented to self and situation but disoriented to time. SLP administered SLUMS examination; pt scored 11/30. Pt score c/b deficits in attention, short term and working memory, orientation to time, and divergent naming with attention being biggest barrier to success this date. Pt required several redirections to attend to divergent naming, clock drawing, and story comprehension tasks. SLP will continue to follow pt for cognitive therapy targeting the aforementioned deficits.    SLP Assessment  SLP Recommendation/Assessment: Patient needs continued Speech [place] [facility] SLP Visit Diagnosis: Cognitive communication deficit (R41.841)    Recommendations for follow up therapy  are one component of a multi-disciplinary discharge planning process, led by the attending physician.  Recommendations may be updated based on patient status, additional functional criteria and insurance authorization.    Follow Up Recommendations  24 hour supervision/assistance    Frequency and Duration min 2x/week  2 weeks      SLP Evaluation Cognition  Overall Cognitive Status: History of cognitive impairments - at baseline Arousal/Alertness: Awake/alert Orientation Level: Oriented to person;Oriented to situation Year: Other (Comment) Month:  (1964) Attention: Sustained Sustained Attention: Impaired Sustained Attention Impairment: Functional basic;Verbal basic Memory: Impaired Memory Impairment: Decreased short term memory Decreased Short Term Memory: Verbal basic Awareness: Impaired Awareness Impairment: Intellectual impairment;Emergent impairment;Anticipatory impairment Problem Solving: Impaired Problem Solving Impairment: Functional basic Executive Function: Self Monitoring;Self Correcting Self Monitoring: Impaired Self Monitoring Impairment: Functional basic Self Correcting: Impaired Self Correcting Impairment: Functional basic Safety/Judgment: Impaired       Comprehension  Auditory Comprehension Overall Auditory Comprehension: Appears within functional limits for tasks assessed Visual Recognition/Discrimination Discrimination: Not tested Reading Comprehension Reading Status: Not tested    Expression Expression Primary Mode of Expression: Verbal Verbal Expression Overall Verbal Expression: Appears within functional limits for tasks assessed Written Expression Dominant Hand: Right Written Expression: Not tested   Oral / Motor  Oral Motor/Sensory Function Overall Oral Motor/Sensory Function: Mild impairment Facial ROM: Reduced right Facial Symmetry: Abnormal symmetry right Facial Strength: Reduced right Lingual ROM: Reduced right Lingual Strength: Reduced  (right) Velum: Within Functional Limits Mandible: Within Functional Limits Motor Speech Overall Motor Speech: Appears within functional limits for tasks assessed   GO           Dewitt Rota, SLP-Student  Dewitt Rota 08/19/2021, 12:54 PM

## 2021-08-19 NOTE — TOC CAGE-AID Note (Signed)
Transition of Care [facility]) - CAGE-AID Screening   Patient Details  Name: Dominique Kirk MRN: [id number] Date of Birth: October 29, 1945  Transition of Care [facility]) CM/SW Contact:    Oluwatoyin Banales C Tarpley-Carter, [place] Phone Number: 08/19/2021, 11:49 AM   Clinical Narrative: Pt is unable to participate in Cage Aid.  Pt is inappropriate for assessment.  Raylei Losurdo Tarpley-Carter, MSW, LCSW-A Pronouns:  She/Her/Hers [facility]itions of Care Clinical Social Worker Direct Number:  [phone] [email or web address]  CAGE-AID Screening: Substance Abuse Screening unable to be completed due to: : Patient unable to participate (Pt is inappropriate for assessment.)             Substance Abuse Education Offered: No

## 2021-08-19 NOTE — Evaluation (Signed)
Physical Therapy Evaluation Patient Details Name: Dominique Kirk MRN: [id number] DOB: Aug 07, 1946 Today's Date: 08/19/2021  History of Present Illness  75 yo female s/p fall 10/28 with generalized fatigue and slightly slower. CT scan reveals 10/31 basal ganglia hemorrhage NIH 4 PMH: HTN, HLD, DM, smoker  Clinical Impression  Pt admitted with above diagnosis. Pt is from home with her daughter who works 1/2 from home and 1/2 in office. Pt has been falling, 3-4x past 6 months. Pt's daughter relays that pt has delayed processing at baseline and is close to her normal today. However, functionally pt ambulates without AD at baseline and today needed UE support to be stable with ambulation. Encouraged use of RW for home, daughter supportive, pt hesitant to use an AD. Recommend youth height RW for home and HHPT for balance program.  Pt currently with functional limitations due to the deficits listed below (see PT Problem List). Pt will benefit from skilled PT to increase their independence and safety with mobility to allow discharge to the venue listed below.          Recommendations for follow up therapy are one component of a multi-disciplinary discharge planning process, led by the attending physician.  Recommendations may be updated based on patient status, additional functional criteria and insurance authorization.  Follow Up Recommendations Home health PT    Assistance Recommended at Discharge Frequent or constant Supervision/Assistance  Functional Status Assessment Patient has had a recent decline in their functional status and demonstrates the ability to make significant improvements in function in a reasonable and predictable amount of time.  Equipment Recommendations  Rolling walker (2 wheels) (youth height (pt is 4'11"))    Recommendations for Other Services       Precautions / Restrictions Precautions Precautions: Fall Precaution Comments: pt reports she has fallen 3-4x past 6 mos,  sometimes able to get up, other times needing assistance Restrictions Weight Bearing Restrictions: No      Mobility  Bed Mobility Overal bed mobility: Needs Assistance Bed Mobility: Supine to Sit     Supine to sit: Supervision     General bed mobility comments: pt able to sit upright in bed from [facility] elevated and pivot to EOB with supervision for safety as pt's feet do not reach the floor due to 4'11" height    Transfers Overall transfer level: Needs assistance Equipment used: 1 person hand held assist Transfers: Sit to/from Stand Sit to Stand: Min assist           General transfer comment: min A to steady    Ambulation/Gait Ambulation/Gait assistance: Min assist Gait Distance (Feet): 150 Feet Assistive device: None Gait Pattern/deviations: Step-through pattern;Wide base of support Gait velocity: decreased Gait velocity interpretation: <1.31 ft/sec, indicative of household ambulator General Gait Details: pt ambulated without AD as she preferred to try without RW but she continuously reached for the rail and wall and reports that she does not feel as steady as her normal and was given consistent min A. Discussed use of RW to progress to supervision level, daughter in favor, pt more hesitant  Stairs            Wheelchair Mobility    Modified Rankin (Stroke Patients Only) Modified Rankin (Stroke Patients Only) Pre-Morbid Rankin Score: Slight disability Modified Rankin: Moderately severe disability     Balance Overall balance assessment: Needs assistance;History of Falls Sitting-balance support: Feet unsupported;No upper extremity supported Sitting balance-Leahy Scale: Good     Standing balance support: No upper extremity supported Standing  balance-Leahy Scale: Fair Standing balance comment: recommend UE support for dynamic balance as pt has been falling at home and unsteady on eval                             Pertinent Vitals/Pain Pain  Assessment: Faces Faces Pain Scale: Hurts little more Pain Location: R sided face Pain Descriptors / Indicators: Sore Pain Intervention(s): Monitored during session;Limited activity within patient's tolerance    Home Living Family/patient expects to be discharged to:: Private residence Living Arrangements: Children Available Help at Discharge: Family;Available PRN/intermittently Type of Home: House Home Access: Stairs to enter Entrance Stairs-Rails: Right;Left;Can reach both Entrance Stairs-Number of Steps: 8   Home Layout: One level Home Equipment: Shower seat Additional Comments: retired from [employer]. daughter works full time, part from home and part in office, she is trying to see if she can increase her time working from home    Prior Function Prior Level of Function : Independent/Modified Independent;Driving;History of Falls (last six months)             Mobility Comments: has fallen ~3 times past 6 mos, does not use AD       Hand Dominance   Dominant Hand: Right    Extremity/Trunk Assessment   Upper Extremity Assessment Upper Extremity Assessment: Defer to OT evaluation    Lower Extremity Assessment Lower Extremity Assessment: Generalized weakness    Cervical / Trunk Assessment Cervical / Trunk Assessment: Kyphotic  Communication   Communication: No difficulties  Cognition Arousal/Alertness: Awake/alert Behavior During Therapy: Flat affect Overall Cognitive Status: History of cognitive impairments - at baseline                                 General Comments: pt with baseline STM deficits and delayed processing. Daughter reports that pt near baseline        General Comments General comments (skin integrity, edema, etc.): swelling noted R face. BP 177/85    Exercises     Assessment/Plan    PT Assessment Patient needs continued PT services  PT Problem List Decreased strength;Decreased activity tolerance;Decreased  balance;Decreased mobility;Decreased knowledge of use of DME;Cardiopulmonary status limiting activity;Pain       PT Treatment Interventions DME instruction;Gait training;Stair training;Functional mobility training;Therapeutic activities;Therapeutic exercise;Balance training;Patient/family education    PT Goals (Current goals can be found in the Care Plan section)  Acute Rehab PT Goals Patient Stated Goal: return home PT Goal Formulation: With patient/family Time For Goal Achievement: 09/02/21 Potential to Achieve Goals: Good    Frequency Min 4X/week   Barriers to discharge Decreased caregiver support daughter in and out of home    Co-evaluation               AM-PAC PT "6 Clicks" Mobility  Outcome Measure Help needed turning from your back to your side while in a flat bed without using bedrails?: None Help needed moving from lying on your back to sitting on the side of a flat bed without using bedrails?: None Help needed moving to and from a bed to a chair (including a wheelchair)?: A Little Help needed standing up from a chair using your arms (e.g., wheelchair or bedside chair)?: A Little Help needed to walk in hospital room?: A Little Help needed climbing 3-5 steps with a railing? : A Lot 6 Click Score: 19    End of Session Equipment  Utilized During Treatment: Gait belt Activity Tolerance: Patient tolerated treatment well Patient left: in chair;with call bell/phone within reach;with family/visitor present Nurse Communication: Mobility status PT Visit Diagnosis: Unsteadiness on feet (R26.81);Muscle weakness (generalized) (M62.81);Difficulty in walking, not elsewhere classified (R26.2);Pain Pain - Right/Left: Right Pain - part of body:  (face)    Time: 9494-4739 PT Time Calculation (min) (ACUTE ONLY): 36 min   Charges:   PT Evaluation $PT Eval Moderate Complexity: 1 Mod PT Treatments $Gait Training: 8-22 mins        Leighton Roach, [place]  Pager [phone] Office [place] 08/19/2021, 1:47 PM

## 2021-08-19 NOTE — Evaluation (Signed)
Occupational Therapy Evaluation Patient Details Name: Dominique Kirk MRN: [id number] DOB: April 01, 1946 Today's Date: 08/19/2021   History of Present Illness 75 yo female s/p fall 10/28 with generalized fatigue and slightly slower. 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 Pt currently with functional limitiations due to the deficits listed below (see OT problem list). Pt currently requires steady (A) or use of RW for balance with transfer. Pt progressing well and daughter present for entire session.  Pt will benefit from skilled OT to increase their independence and safety with adls and balance to allow discharge hhot.      Recommendations for follow up therapy are one component of a multi-disciplinary discharge planning process, led by the attending physician.  Recommendations may be updated based on patient status, additional functional criteria and insurance authorization.   Follow Up Recommendations  Home health OT    Assistance Recommended at Discharge PRN  Functional Status Assessment     Equipment Recommendations  Other (comment) (RW)    Recommendations for Other Services       Precautions / Restrictions Precautions Precautions: Fall Precaution Comments: pt reports she has fallen 3-4x past 6 mos, sometimes able to get up, other times needing assistance      Mobility Bed Mobility                    Transfers Overall transfer level: Needs assistance   Transfers: Sit to/from Stand Sit to Stand: Min guard           General transfer comment: using RW for balance      Balance Overall balance assessment: Needs assistance;History of Falls Sitting-balance support: Feet unsupported;No upper extremity supported Sitting balance-Leahy Scale: Good     Standing balance support: No upper extremity supported Standing balance-Leahy Scale: Fair Standing balance  comment: recommend UE support for dynamic balance as pt has been falling at home and unsteady on eval                           ADL either performed or assessed with clinical judgement   ADL Overall ADL's : Needs assistance/impaired     Grooming: Wash/dry hands;Wash/dry face;Oral care;Supervision/safety   Upper Body Bathing: Supervision/ safety   Lower Body Bathing: Supervison/ safety   Upper Body Dressing : Supervision/safety   Lower Body Dressing: Supervision/safety;Sit to/from stand   Toilet Transfer: Supervision/safety   Toileting- Water quality scientist and Hygiene: Supervision/safety       Functional mobility during ADLs: Supervision/safety General ADL Comments: using Rw for steady this session     Vision   Additional Comments: noted to have R eye edema but denies any visual changes. assessment none noted. able to scan in all fields     Perception     Praxis      Pertinent Vitals/Pain Pain Assessment: Faces Faces Pain Scale: Hurts little more Pain Location: R sided face Pain Descriptors / Indicators: Sore Pain Intervention(s): Monitored during session     Hand Dominance Right   Extremity/Trunk Assessment Upper Extremity Assessment Upper Extremity Assessment: Overall WFL for tasks assessed   Lower Extremity Assessment Lower Extremity Assessment: Overall WFL for tasks assessed   Cervical / Trunk Assessment Cervical / Trunk Assessment: Kyphotic   Communication Communication Communication: No difficulties   Cognition Arousal/Alertness: Awake/alert Behavior During Therapy: Flat affect Overall Cognitive Status: History of cognitive  impairments - at baseline                                 General Comments: pt with baseline STM deficits and delayed processing. Daughter reports that pt near baseline     General Comments       Exercises     Shoulder Instructions      Home Living Family/patient expects to be discharged  to:: Private residence Living Arrangements: Children Available Help at Discharge: Family;Available PRN/intermittently Type of Home: House Home Access: Stairs to enter [employer] of Steps: 8 Entrance Stairs-Rails: Right;Left;Can reach both Home Layout: One level     Bathroom Shower/Tub: Occupational psychologist: Standard Bathroom Accessibility: Yes   Home Equipment: Building services engineer Comments: retired from [employer]. daughter works full time, part from home and part in office, she is trying to see if she can increase her time working from home  Lives With: Daughter    Prior Functioning/Environment Prior Level of Function : Independent/Modified Independent;Driving;History of Falls (last six months)             Mobility Comments: has fallen ~3 times past 6 mos, does not use AD          OT Problem List: Decreased activity tolerance;Impaired balance (sitting and/or standing)      OT Treatment/Interventions: Self-care/ADL training;Energy conservation;DME and/or AE instruction;Therapeutic activities;Visual/perceptual remediation/compensation;Patient/family education;Balance training    OT Goals(Current goals can be found in the care plan section) Acute Rehab OT Goals Patient Stated Goal: none stated OT Goal Formulation: With patient/family Time For Goal Achievement: 09/02/21 Potential to Achieve Goals: Good  OT Frequency: Min 2X/week   Barriers to D/C:            Co-evaluation              AM-PAC OT "6 Clicks" Daily Activity     Outcome Measure Help from another person eating meals?: A Little Help from another person taking care of personal grooming?: A Little Help from another person toileting, which includes using toliet, bedpan, or urinal?: A Little Help from another person bathing (including washing, rinsing, drying)?: A Little Help from another person to put on and taking off regular upper body clothing?: A Little Help  from another person to put on and taking off regular lower body clothing?: A Little 6 Click Score: 18   End of Session Equipment Utilized During Treatment: Gait belt;Rolling walker (2 wheels) Nurse Communication: Mobility status;Precautions  Activity Tolerance:   Patient left: in bed;with call bell/phone within reach;with family/visitor present  OT Visit Diagnosis: Unsteadiness on feet (R26.81)                Time: 6433-2951 OT Time Calculation (min): 29 min Charges:  OT General Charges $OT Visit: 1 Visit OT Evaluation $OT Eval Moderate Complexity: 1 Mod   Brynn, OTR/L  Acute Rehabilitation Services Pager: [phone] Office: [phone] .   Jeri Modena 08/19/2021, 6:01 PM

## 2021-08-19 NOTE — Progress Notes (Signed)
Carotid duplex has been completed.   Preliminary results in CV Proc.   Jinny Blossom Jamie-Lee Galdamez 08/19/2021 10:34 AM

## 2021-08-20 DIAGNOSIS — E119 Type 2 diabetes mellitus without complications: Secondary | ICD-10-CM

## 2021-08-20 DIAGNOSIS — I610 Nontraumatic intracerebral hemorrhage in hemisphere, subcortical: Secondary | ICD-10-CM | POA: Diagnosis not present

## 2021-08-20 DIAGNOSIS — E785 Hyperlipidemia, unspecified: Secondary | ICD-10-CM | POA: Diagnosis present

## 2021-08-20 DIAGNOSIS — I10 Essential (primary) hypertension: Secondary | ICD-10-CM

## 2021-08-20 DIAGNOSIS — I619 Nontraumatic intracerebral hemorrhage, unspecified: Secondary | ICD-10-CM | POA: Diagnosis not present

## 2021-08-20 DIAGNOSIS — E1159 Type 2 diabetes mellitus with other circulatory complications: Secondary | ICD-10-CM

## 2021-08-20 LAB — LIPID PANEL
Cholesterol: 124 mg/dL (ref 0–200)
HDL: 48 mg/dL (ref >40)
LDL Cholesterol: 58 mg/dL (ref 0–99)
Total CHOL/HDL Ratio: 2.6 RATIO
Triglycerides: 92 mg/dL (ref <150)
VLDL: 18 mg/dL (ref 0–40)

## 2021-08-20 LAB — CBC
HCT: 26.3 % — ABNORMAL LOW (ref 36.0–46.0)
Hemoglobin: 8.5 g/dL — ABNORMAL LOW (ref 12.0–15.0)
MCH: 28.2 pg (ref 26.0–34.0)
MCHC: 32.3 g/dL (ref 30.0–36.0)
MCV: 87.4 fL (ref 80.0–100.0)
Platelets: 292 10*3/uL (ref 150–400)
RBC: 3.01 MIL/uL — ABNORMAL LOW (ref 3.87–5.11)
RDW: 14.9 % (ref 11.5–15.5)
WBC: 12.7 10*3/uL — ABNORMAL HIGH (ref 4.0–10.5)
nRBC: 0 % (ref 0.0–0.2)

## 2021-08-20 LAB — GLUCOSE, CAPILLARY
Glucose-Capillary: 245 mg/dL — ABNORMAL HIGH (ref 70–99)
Glucose-Capillary: 149 mg/dL — ABNORMAL HIGH (ref 70–99)

## 2021-08-20 LAB — BASIC METABOLIC PANEL
Anion gap: 9 (ref 5–15)
BUN: 40 mg/dL — ABNORMAL HIGH (ref 8–23)
CO2: 18 mmol/L — ABNORMAL LOW (ref 22–32)
Calcium: 8.7 mg/dL — ABNORMAL LOW (ref 8.9–10.3)
Chloride: 108 mmol/L (ref 98–111)
Creatinine, Ser: 2.29 mg/dL — ABNORMAL HIGH (ref 0.44–1.00)
GFR, Estimated: 22 mL/min — ABNORMAL LOW (ref >60)
Glucose, Bld: 278 mg/dL — ABNORMAL HIGH (ref 70–99)
Potassium: 4.4 mmol/L (ref 3.5–5.1)
Sodium: 135 mmol/L (ref 135–145)

## 2021-08-20 MED ORDER — ACETAMINOPHEN 325 MG PO TABS: 650.0000 mg | ORAL_TABLET | ORAL | 0 refills | Status: DC | PRN

## 2021-08-20 MED ORDER — HYDRALAZINE HCL 50 MG PO TABS: 50.0000 mg | ORAL_TABLET | Freq: Three times a day (TID) | ORAL | 1 refills | Status: AC

## 2021-08-20 MED ORDER — AMLODIPINE BESYLATE 10 MG PO TABS: 10.0000 mg | ORAL_TABLET | Freq: Every day | ORAL | 0 refills | Status: AC

## 2021-08-20 MED ORDER — HYDRALAZINE HCL 50 MG PO TABS
50.0000 mg | ORAL_TABLET | Freq: Three times a day (TID) | ORAL | Status: DC
  Administered 2021-08-20: 50 mg via ORAL
  Filled 2021-08-20: qty 1

## 2021-08-20 MED ORDER — HYDRALAZINE HCL 25 MG PO TABS: 50.0000 mg | ORAL_TABLET | Freq: Three times a day (TID) | ORAL | 0 refills | Status: DC

## 2021-08-20 NOTE — Discharge Summary (Addendum)
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   amLODipine 10 MG tablet Commonly known as: NORVASC Take 1 tablet (10 mg total) by mouth daily. Start taking on: August 21, 2021 What changed:  medication strength how much to take   ferrous sulfate 325 (65 FE) MG tablet Take 325 mg by mouth daily with breakfast.   hydrALAZINE 50 MG tablet Commonly known as: APRESOLINE Take 1 tablet (50 mg total) by mouth every 8 (eight) hours.   metFORMIN 500 MG tablet Commonly known as: GLUCOPHAGE Take 500 mg by mouth 2 (two) times daily.   NovoLIN 70/30 Kwikpen (70-30) 100 UNIT/ML KwikPen Generic drug: insulin isophane & regular human KwikPen Inject 12-14 Units into the skin See admin instructions. Inject 12 units under the skin in the morning, and 14 units under the skin at bedtime   QC TUMERIC COMPLEX PO Take 1 capsule by mouth daily.   rosuvastatin 20 MG tablet Commonly known as: CRESTOR Take 20 mg by mouth at bedtime.               Durable Medical Equipment  (From admission, onward)           Start     Ordered   08/20/21 1234  DME Walker  Once       Question Answer Comment   Walker: With 5 Inch Wheels   Patient needs a walker to treat with the following condition ICH (intracerebral hemorrhage) ([place])      08/20/21 1239            LABORATORY STUDIES CBC    Component Value Date/Time   WBC 12.7 (H) 08/20/2021 0445   RBC 3.01 (L) 08/20/2021 0445   HGB 8.5 (L) 08/20/2021 0445   HCT 26.3 (L) 08/20/2021 0445   PLT 292 08/20/2021 0445   MCV 87.4 08/20/2021 0445   MCH 28.2 08/20/2021 0445   MCHC 32.3 08/20/2021 0445   RDW 14.9 08/20/2021 0445   LYMPHSABS 1.8 08/18/2021 1614   MONOABS 0.9 08/18/2021 1614   EOSABS 0.1 08/18/2021 1614   BASOSABS 0.1 08/18/2021 1614   CMP    Component Value Date/Time   NA 135 08/20/2021 0445   K 4.4 08/20/2021 0445   CL 108 08/20/2021 0445   CO2 18 (L) 08/20/2021 0445   GLUCOSE 278 (H) 08/20/2021 0445   BUN 40 (H) 08/20/2021 0445   CREATININE 2.29 (H) 08/20/2021 0445   CALCIUM 8.7 (L) 08/20/2021 0445   PROT 7.9 08/18/2021 1614   ALBUMIN 4.1 08/18/2021 1614   AST 16 08/18/2021 1614  ALT 17 08/18/2021 1614   ALKPHOS 105 08/18/2021 1614   BILITOT 0.3 08/18/2021 1614   GFRNONAA 22 (L) 08/20/2021 0445   COAGS Lab Results  Component Value Date   INR 1.0 08/18/2021   Lipid Panel    Component Value Date/Time   CHOL 124 08/20/2021 0445   TRIG 92 08/20/2021 0445   HDL 48 08/20/2021 0445   CHOLHDL 2.6 08/20/2021 0445   VLDL 18 08/20/2021 0445   LDLCALC 58 08/20/2021 0445   HgbA1C  Lab Results  Component Value Date   HGBA1C 5.4 08/18/2021   Urinalysis No results found for: COLORURINE, APPEARANCEUR, LABSPEC, PHURINE, GLUCOSEU, HGBUR, BILIRUBINUR, KETONESUR, PROTEINUR, UROBILINOGEN, NITRITE, LEUKOCYTESUR Urine Drug Screen No results found for: LABOPIA, COCAINSCRNUR, LABBENZ, AMPHETMU, THCU, LABBARB  Alcohol Level No results found for: ETH   SIGNIFICANT DIAGNOSTIC STUDIES CT HEAD WO CONTRAST (5MM)  Result Date: 08/18/2021 CLINICAL DATA:  [place] 3 days ago with trauma to the head and face. EXAM: CT HEAD  WITHOUT CONTRAST TECHNIQUE: Contiguous axial images were obtained from the base of the skull through the vertex without intravenous contrast. COMPARISON:  None. FINDINGS: Brain: No focal abnormality affects the brainstem or cerebellum. There is intraparenchymal hemorrhage within the left basal ganglia with mild surrounding edema. Findings could relate to a primary hypertensive hemorrhage or hemorrhagic infarction. No significant mass effect. Mild chronic small-vessel ischemic changes otherwise affect the cerebral hemispheric white matter. No hydrocephalus or extra-axial collection. Vascular: There is atherosclerotic calcification of the major vessels at the base of the brain. Skull: Negative Sinuses/Orbits: Clear/normal Other: None IMPRESSION: Acute to early subacute hemorrhage in the left basal ganglia which could be a hypertensive hemorrhage or hemorrhagic infarction. Mild surrounding edema. Mild swelling but no brain shift. Chronic small-vessel ischemic changes otherwise affect the cerebral hemispheric white matter. Call report in progress. Electronically Signed   By: Nelson Chimes M.D.   On: 08/18/2021 15:02   MR ANGIO HEAD WO CONTRAST  Result Date: 08/19/2021 CLINICAL DATA:  Cerebral hemorrhage EXAM: MRI HEAD WITHOUT CONTRAST MRA HEAD WITHOUT CONTRAST TECHNIQUE: Multiplanar, multi-echo pulse sequences of the brain and surrounding structures were acquired without intravenous contrast. Angiographic images of the Circle of Willis were acquired using MRA technique without intravenous contrast. COMPARISON:  Prior MRIs available for comparison. Correlation is made with CT head 08/18/2021 FINDINGS: MRI HEAD FINDINGS Brain: Redemonstrated hemorrhage centered in the left basal ganglia, which measures up to 22 x 9 x 8 mm (series 8, image 25 and series 4, image 15), likely unchanged from the prior exam. This area demonstrates T1 hyper and hypointense signal, and T2 hypointense signal, consistent with acute and subacute  blood products. Mild surrounding increased T2 signal, likely edema. Confluent T2 hyperintense signal in the periventricular white matter and pons, likely the sequela of severe chronic small vessel ischemic disease. No additional foci of hemosiderin deposition to suggest remote hemorrhage. Vascular: Normal flow voids. Skull and upper cervical spine: Normal marrow signal. Sinuses/Orbits: No acute or significant finding. Other: None. MRA HEAD FINDINGS Anterior circulation: Both internal carotid arteries are patent to the termini. 2 mm anteromedially directed outpouching from the distal right ICA (series 200, likely a tiny aneurysm. Mild stenosis in the carotid siphons. A1 segments patent. Normal anterior communicating artery. Anterior cerebral arteries are patent to their distal aspects. No M1 stenosis or occlusion. Normal MCA bifurcations. Distal MCA branches perfused and symmetric. Posterior circulation: Vertebral arteries patent to the vertebrobasilar junction, with multifocal stenosis in the distal right V4 segment (series 2, image  24 and 40). Posterior inferior cerebral arteries patent bilaterally. Basilar patent to its distal aspect. Superior cerebral arteries patent bilaterally. PCAs well perfused to their distal aspects without stenosis. The bilateral posterior communicating arteries are visualized. Anatomic variants: None significant IMPRESSION: 1. Redemonstrated hemorrhage centered in the left basal ganglia, likely acute and subacute blood products. 2. No large vessel occlusion or hemodynamically significant stenosis. 3. 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 Electronically Signed   By: Merilyn Baba M.D.   On: 08/19/2021 18:11   MR BRAIN WO CONTRAST  Result Date: 08/19/2021 CLINICAL DATA:  Cerebral hemorrhage EXAM: MRI HEAD WITHOUT CONTRAST MRA HEAD WITHOUT CONTRAST TECHNIQUE: Multiplanar, multi-echo pulse sequences of the brain and surrounding structures were acquired  without intravenous contrast. Angiographic images of the Circle of Willis were acquired using MRA technique without intravenous contrast. COMPARISON:  Prior MRIs available for comparison. Correlation is made with CT head 08/18/2021 FINDINGS: MRI HEAD FINDINGS Brain: Redemonstrated hemorrhage centered in the left basal ganglia, which measures up to 22 x 9 x 8 mm (series 8, image 25 and series 4, image 15), likely unchanged from the prior exam. This area demonstrates T1 hyper and hypointense signal, and T2 hypointense signal, consistent with acute and subacute blood products. Mild surrounding increased T2 signal, likely edema. Confluent T2 hyperintense signal in the periventricular white matter and pons, likely the sequela of severe chronic small vessel ischemic disease. No additional foci of hemosiderin deposition to suggest remote hemorrhage. Vascular: Normal flow voids. Skull and upper cervical spine: Normal marrow signal. Sinuses/Orbits: No acute or significant finding. Other: None. MRA HEAD FINDINGS Anterior circulation: Both internal carotid arteries are patent to the termini. 2 mm anteromedially directed outpouching from the distal right ICA (series 200, likely a tiny aneurysm. Mild stenosis in the carotid siphons. A1 segments patent. Normal anterior communicating artery. Anterior cerebral arteries are patent to their distal aspects. No M1 stenosis or occlusion. Normal MCA bifurcations. Distal MCA branches perfused and symmetric. Posterior circulation: Vertebral arteries patent to the vertebrobasilar junction, with multifocal stenosis in the distal right V4 segment (series 2, image 24 and 40). Posterior inferior cerebral arteries patent bilaterally. Basilar patent to its distal aspect. Superior cerebral arteries patent bilaterally. PCAs well perfused to their distal aspects without stenosis. The bilateral posterior communicating arteries are visualized. Anatomic variants: None significant IMPRESSION: 1.  Redemonstrated hemorrhage centered in the left basal ganglia, likely acute and subacute blood products. 2. No large vessel occlusion or hemodynamically significant stenosis. 3. 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 Electronically Signed   By: Merilyn Baba M.D.   On: 08/19/2021 18:11   ECHOCARDIOGRAM COMPLETE  Result Date: 08/19/2021    ECHOCARDIOGRAM REPORT   Patient Name:   DEMAYA HARDGE Date of Exam: 08/19/2021 Medical Rec #:  [id number]     Height:       59.0 in Accession #:    [id number]    Weight:       166.0 lb Date of Birth:  1946-01-06    BSA:          1.704 m Patient Age:    75 years      BP:           142/49 mmHg Patient Gender: F             HR:           70 bpm. Exam Location:  Inpatient Procedure: 2D Echo, Color Doppler, Cardiac Doppler  and Intracardiac            Opacification Agent Indications:    Stroke  History:        Patient has no prior history of Echocardiogram examinations.  Sonographer:    Jyl Heinz Referring Phys: [phone] MCNEILL P [place]  1. Left ventricular ejection fraction, by estimation, is 65 to 70%. The left ventricle has normal function. The left ventricle has no regional wall motion abnormalities. There is mild left ventricular hypertrophy. Left ventricular diastolic parameters are indeterminate.  2. Right ventricular systolic function is normal. The right ventricular size is normal.  3. Trivial mitral valve regurgitation.  4. AV is thickened, calcified with mildly restricted motion Peak and mean gradients though the valve are 19 and 11 mm Hg respectively consistent with mild AS> . The aortic valve is tricuspid. Aortic valve regurgitation is not visualized.  5. The inferior vena cava is normal in size with greater than 50% respiratory variability, suggesting right atrial pressure of 3 mmHg. FINDINGS  Left Ventricle: Left ventricular ejection fraction, by estimation, is 65 to 70%. The left ventricle has normal function. The  left ventricle has no regional wall motion abnormalities. The left ventricular internal cavity size was normal in size. There is  mild left ventricular hypertrophy. Left ventricular diastolic parameters are indeterminate. Right Ventricle: The right ventricular size is normal. Right vetricular wall thickness was not assessed. Right ventricular systolic function is normal. Left Atrium: Left atrial size was normal in size. Right Atrium: Right atrial size was normal in size. Pericardium: There is no evidence of pericardial effusion. Mitral Valve: There is mild thickening of the mitral valve leaflet(s). Mild mitral annular calcification. Trivial mitral valve regurgitation. Tricuspid Valve: The tricuspid valve is normal in structure. Tricuspid valve regurgitation is trivial. Aortic Valve: AV is thickened, calcified with mildly restricted motion Peak and mean gradients though the valve are 19 and 11 mm Hg respectively consistent with mild AS>. The aortic valve is tricuspid. Aortic valve regurgitation is not visualized. Aortic  valve mean gradient measures 11.0 mmHg. Aortic valve peak gradient measures 19.4 mmHg. Aortic valve area, by VTI measures 1.81 cm. Pulmonic Valve: The pulmonic valve was normal in structure. Pulmonic valve regurgitation is not visualized. Aorta: The aortic root and ascending aorta are structurally normal, with no evidence of dilitation. Venous: The inferior vena cava is normal in size with greater than 50% respiratory variability, suggesting right atrial pressure of 3 mmHg. IAS/Shunts: No atrial level shunt detected by color flow Doppler.  LEFT VENTRICLE PLAX 2D LVIDd:         4.21 cm     Diastology LVIDs:         2.56 cm     LV e' medial:    5.44 cm/s LV PW:         1.29 cm     LV E/e' medial:  20.2 LV IVS:        1.17 cm     LV e' lateral:   4.68 cm/s LVOT diam:     2.00 cm     LV E/e' lateral: 23.5 LV SV:         82 LV SV Index:   48 LVOT Area:     3.14 cm  LV Volumes (MOD) LV vol d, MOD A2C:  76.4 ml LV vol d, MOD A4C: 62.6 ml LV vol s, MOD A2C: 22.9 ml LV vol s, MOD A4C: 19.6 ml LV SV MOD A2C:     53.5  ml LV SV MOD A4C:     62.6 ml LV SV MOD BP:      47.6 ml RIGHT VENTRICLE RV Basal diam:  3.43 cm RV Mid diam:    2.33 cm RV S prime:     14.10 cm/s TAPSE (M-mode): 2.1 cm LEFT ATRIUM             Index        RIGHT ATRIUM          Index LA diam:        3.20 cm 1.88 cm/m   RA Area:     9.09 cm 5.33 cm/m LA Vol (A2C):   37.7 ml 22.13 ml/m LA Vol (A4C):   43.5 ml 25.53 ml/m LA Biplane Vol: 42.7 ml 25.06 ml/m  AORTIC VALVE AV Area (Vmax):    1.66 cm AV Area (Vmean):   1.83 cm AV Area (VTI):     1.81 cm AV Vmax:           220.00 cm/s AV Vmean:          157.667 cm/s AV VTI:            0.455 m AV Peak Grad:      19.4 mmHg AV Mean Grad:      11.0 mmHg LVOT Vmax:         116.50 cm/s LVOT Vmean:        91.900 cm/s LVOT VTI:          0.262 m LVOT/AV VTI ratio: 0.58  AORTA Ao Root diam: 2.50 cm Ao Asc diam:  2.50 cm MITRAL VALVE MV Area (PHT): 3.13 cm     SHUNTS MV Decel Time: 242 msec     Systemic VTI:  0.26 m MV E velocity: 110.00 cm/s  Systemic Diam: 2.00 cm MV A velocity: 141.00 cm/s MV E/A ratio:  0.78 Dorris Carnes MD Electronically signed by Dorris Carnes MD Signature Date/Time: 08/19/2021/2:24:00 PM    Final    VAS US CAROTID  Result Date: 08/19/2021 Carotid Arterial Duplex Study Patient Name:  XIAO GRAUL  Date of Exam:   08/19/2021 Medical Rec #: [id number]      Accession #:    [id number] Date of Birth: 1946/07/14     Patient Gender: F Patient Age:   75 years Exam Location:  [facility] Procedure:      VAS US CAROTID Referring Phys: Rosalin Hawking --------------------------------------------------------------------------------  Indications:       CVA. Risk Factors:      Hypertension, hyperlipidemia, Diabetes. Limitations        Today's exam was limited due to patient positioning and the                    body habitus of the patient. Comparison Study:  no prior Performing Technologist: Archie Patten RVS  Examination Guidelines: A complete evaluation includes B-mode imaging, spectral Doppler, color Doppler, and power Doppler as needed of all accessible portions of each vessel. Bilateral testing is considered an integral part of a complete examination. Limited examinations for reoccurring indications may be performed as noted.  Right Carotid Findings: +----------+--------+--------+--------+------------------+--------+           PSV cm/sEDV cm/sStenosisPlaque DescriptionComments +----------+--------+--------+--------+------------------+--------+ CCA Prox  81      9               heterogenous               +----------+--------+--------+--------+------------------+--------+ CCA Distal86  13              heterogenous               +----------+--------+--------+--------+------------------+--------+ ICA Prox  144     27      1-39%   heterogenous               +----------+--------+--------+--------+------------------+--------+ ICA Distal62      13                                         +----------+--------+--------+--------+------------------+--------+ ECA       93                                                 +----------+--------+--------+--------+------------------+--------+ +----------+--------+-------+--------+-------------------+           PSV cm/sEDV cmsDescribeArm Pressure (mmHG) +----------+--------+-------+--------+-------------------+ [id number]                                        +----------+--------+-------+--------+-------------------+ +---------+--------+--+--------+--+---------+ VertebralPSV cm/s64EDV cm/s14Antegrade +---------+--------+--+--------+--+---------+  Left Carotid Findings: +----------+--------+--------+--------+------------------+--------+           PSV cm/sEDV cm/sStenosisPlaque DescriptionComments +----------+--------+--------+--------+------------------+--------+ CCA Prox  137     16               heterogenous               +----------+--------+--------+--------+------------------+--------+ CCA Distal97      11              heterogenous               +----------+--------+--------+--------+------------------+--------+ ICA Prox  112     22      1-39%   heterogenous      tortuous +----------+--------+--------+--------+------------------+--------+ ICA Distal77      16                                         +----------+--------+--------+--------+------------------+--------+ ECA       93                                                 +----------+--------+--------+--------+------------------+--------+ +----------+--------+--------+--------+-------------------+           PSV cm/sEDV cm/sDescribeArm Pressure (mmHG) +----------+--------+--------+--------+-------------------+ [id number]                                         +----------+--------+--------+--------+-------------------+ +---------+--------+--+--------+--+---------+ VertebralPSV cm/s62EDV cm/s17Antegrade +---------+--------+--+--------+--+---------+      Preliminary    CT MAXILLOFACIAL WO CONTRAST  Result Date: 08/18/2021 CLINICAL DATA:  [place] 3 days ago with trauma to the right side of the face. EXAM: CT MAXILLOFACIAL WITHOUT CONTRAST TECHNIQUE: Multidetector CT imaging of the maxillofacial structures was performed. Multiplanar CT image reconstructions were also generated. COMPARISON:  None. FINDINGS: Osseous: No facial fracture. The patient has chronic dental and periodontal disease. Orbits: No intraorbital injury. Sinuses: No  fluid in the sinuses. Soft tissues: Soft tissue bruising in the superficial soft tissues of the right face and cheek. Limited intracranial: See results of head CT. IMPRESSION: No facial fracture. Soft tissue hematoma of the right face and cheek. Advanced dental and periodontal disease. Electronically Signed   By: Nelson Chimes M.D.   On: 08/18/2021 15:04      HISTORY OF  PRESENT ILLNESS Ms. Sarris is a 75 yo female patient with a history of HTN, HLD and diabetes who presented after a fall at home on 10/29.  After the fall, she developed some generalized fatigue and noticed that she was moving more slowly.  On 10/31, she contacted her PCP and was advised to present to the ED for a CT scan.  In the ED, she was found to have a left anterior basal ganglia ICH.  HOSPITAL COURSE Ms. Bettcher was found to be hypertensive on admission and was admitted to the ICU for control of blood pressure with IV clevidipine.  This was able to be discontinued on 11/1.  Patient was evaluated by PT and OT and today will discharge home with home PT/OT.  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   Other Active Problems Fall at home -> right facial swelling AKI on CKD IIIb, Cre 2.60->2.29   RN Pressure Injury Documentation:     DISCHARGE EXAM Blood pressure (!) 160/48, pulse 97, temperature 98.6 F (37 C), temperature source Oral, resp. rate (!) 23, height 4\' 11"  (1.499 m), weight 75.3 kg, SpO2 100 %. General: Patient is an alert, well-nourished female in no acute distress  NEURO:  Mental  Status: AA&Ox3  Speech/Language: speech is without dysarthria or aphasia.  Naming, repetition, fluency, and comprehension intact.  Cranial Nerves:  II: PERRL.  III, IV, VI: EOMI. Eyelids elevate symmetrically.  V: Sensation is intact to light touch and symmetrical to face.  VII: Right facial swelling noted, improved from yesterday VIII: hearing intact to voice. IX, X: Phonation is normal.  XII: tongue is midline without fasciculations. Motor: 5/5 strength to all muscle groups tested.  Tone: is normal and bulk is normal Sensation- Intact to light touch bilaterally.  Coordination: FTN intact bilaterally Slight drift to RLE  Gait- deferred     Discharge Diet       Diet   Diet heart healthy/carb modified Room service appropriate? Yes with Assist; Fluid consistency: Thin   liquids  DISCHARGE PLAN Disposition:  To home with home health PT/OT No antithrombotic for secondary stroke prevention due to [place] Ongoing stroke risk factor control by Primary Care Physician at time of discharge Follow-up PCP Serita Grammes, MD in 2 weeks. Follow-up in [facility] in 4 weeks, office  to schedule an appointment.   35 minutes were spent preparing discharge.  Rosalin Hawking, MD PhD Stroke Neurology 08/20/2021 1:42 PM

## 2021-08-20 NOTE — Progress Notes (Signed)
D/C education given to Pt and all questions answered. No printed prescriptions to give and equipment delivered to room prior to d/c . IV removed. Pt taken to car with all belongings.

## 2021-08-20 NOTE — Progress Notes (Signed)
Inpatient Diabetes Program Recommendations  AACE/ADA: New Consensus Statement on Inpatient Glycemic Control (2015)  Target Ranges:  Prepandial:   less than 140 mg/dL      Peak postprandial:   less than 180 mg/dL (1-2 hours)      Critically ill patients:  140 - 180 mg/dL   Lab Results  Component Value Date   GLUCAP 245 (H) 08/20/2021   HGBA1C 5.4 08/18/2021    Review of Glycemic Control Results for Dominique Kirk, Dominique Kirk (MRN [id number]) as of 08/20/2021 10:26  Ref. Range 08/19/2021 18:05 08/19/2021 22:51 08/20/2021 07:56  Glucose-Capillary Latest Ref Range: 70 - 99 mg/dL 255 (H) 316 (H) 245 (H)   Diabetes history: Type 2 DM Outpatient Diabetes medications: Metformin 500 mg BID, 70/30 12 units QAM, 14 units QPM Current orders for Inpatient glycemic control: Novolog 0-15 units TID  Inpatient Diabetes Program Recommendations:    Consider adding Levemir 15 units QD.   Thanks, Bronson Curb, MSN, RNC-OB Diabetes Coordinator [phone] (8a-5p)

## 2021-08-20 NOTE — TOC Transition Note (Signed)
Transition of Care [facility]) - CM/SW Discharge Note   Patient Details  Name: Dominique Kirk MRN: [id number] Date of Birth: 1946/07/09  Transition [facility] CM/SW Contact:  Ella Bodo, RN Phone Number: 08/20/2021, 1330   Clinical Narrative:    75 yo female s/p fall 10/28 with generalized fatigue and slightly slower. 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  Prior to admission, patient independent and living at home with daughter, who works part-time from home.  PT/OT recommending home health follow-up, and patient agreeable to services.  Referral to [facility] for follow-up therapies.  Referral to [place] for youth rolling walker, to be delivered to bedside prior to discharge.   Final next level of care: [place] Barriers to Discharge: Barriers Resolved   Patient Goals and CMS Choice Patient states their goals for this hospitalization and ongoing recovery are:: to go home CMS Medicare.gov Compare Post Acute Care list provided to:: Patient Choice offered to / list presented to : Patient, Sibling                        Discharge Plan and Services   Discharge Planning Services: CM Consult Post Acute Care Choice: Home Health          DME Arranged: Walker rolling (Youth) DME Agency: [employer] Date DME Agency Contacted: 08/20/21 Time DME Agency Contacted: [phone] Representative spoke with at DME Agency: Pura [facility] HH Arranged: PT, OT [place] Agency: [facility] Date [place]: 08/20/21 Time [place]: 1315 Representative spoke with at [place]: [place] (SDOH) Interventions     Readmission Risk Interventions No flowsheet data found.  Reinaldo Raddle, RN, BSN  Trauma/Neuro ICU Case Manager [phone]

## 2021-09-01 DIAGNOSIS — N179 Acute kidney failure, unspecified: Secondary | ICD-10-CM | POA: Diagnosis not present

## 2021-09-01 DIAGNOSIS — Z1331 Encounter for screening for depression: Secondary | ICD-10-CM | POA: Diagnosis not present

## 2021-09-01 DIAGNOSIS — I10 Essential (primary) hypertension: Secondary | ICD-10-CM | POA: Diagnosis not present

## 2021-09-01 DIAGNOSIS — D649 Anemia, unspecified: Secondary | ICD-10-CM | POA: Diagnosis not present

## 2021-09-01 DIAGNOSIS — Z6833 Body mass index (BMI) 33.0-33.9, adult: Secondary | ICD-10-CM | POA: Diagnosis not present

## 2021-09-23 ENCOUNTER — Ambulatory Visit: Payer: Medicare HMO | Admitting: Adult Health

## 2021-09-23 ENCOUNTER — Encounter: Payer: Self-pay | Admitting: Adult Health

## 2021-09-23 VITALS — BP 132/60 | HR 95 | Ht 59.0 in | Wt 171.0 lb

## 2021-09-23 DIAGNOSIS — I610 Nontraumatic intracerebral hemorrhage in hemisphere, subcortical: Secondary | ICD-10-CM

## 2021-09-23 DIAGNOSIS — E782 Mixed hyperlipidemia: Secondary | ICD-10-CM

## 2021-09-23 DIAGNOSIS — E1169 Type 2 diabetes mellitus with other specified complication: Secondary | ICD-10-CM | POA: Diagnosis not present

## 2021-09-23 DIAGNOSIS — Z794 Long term (current) use of insulin: Secondary | ICD-10-CM | POA: Diagnosis not present

## 2021-09-23 DIAGNOSIS — I671 Cerebral aneurysm, nonruptured: Secondary | ICD-10-CM | POA: Diagnosis not present

## 2021-09-23 DIAGNOSIS — I10 Essential (primary) hypertension: Secondary | ICD-10-CM | POA: Diagnosis not present

## 2021-09-23 NOTE — Patient Instructions (Addendum)
Continue Crestor for secondary stroke prevention  Continue to follow up with PCP regarding blood pressure, diabetes and cholesterol management  Maintain strict control of hypertension with blood pressure goal below 130/90, diabetes with hemoglobin A1c goal below 7.0% and cholesterol with LDL cholesterol (bad cholesterol) goal below 70 mg/dL.   You will be called to schedule a CT scan of your head to look for resolution of prior bleed  Around April, yo uwill be called to repeat vessel imaging to monitor your small aneurysm - very important to monitor blood pressure and ensure good control and completely stop smoking      Followup in the future with me in 6 months or call earlier if needed       Thank you for coming to see [place] at [facility]. I hope we have been able to provide you high quality care today.  You may receive a patient satisfaction survey over the next few weeks. We would appreciate your feedback and comments so that we may continue to improve ourselves and the health of our patients.  Hemorrhagic Stroke A hemorrhagic stroke happens when a blood vessel in the brain leaks or bursts (ruptures). This causes bleeding in or around the brain (hemorrhage) and leads to the sudden death of brain tissue. A hemorrhagic stroke is a medical emergency. It can cause brain damage and death. There are two major types of hemorrhagic stroke: Intracerebral hemorrhage. This happens when bleeding occurs within the brain tissue. Subarachnoid hemorrhage. This happens when bleeding occurs in the area between the brain and the membrane that covers the brain (subarachnoid space). What are the causes? This condition may be caused by: Head injury (trauma). Part of a weakened blood vessel wall bulging or ballooning out (cerebral aneurysm). Thin and hardened blood vessels due to plaque buildup. Tangled blood vessels in the brain (arteriovenous malformation). Protein buildup on the  artery walls of the brain (amyloid angiopathy). Inflamed blood vessels (vasculitis). A brain tumor. Sometimes, the cause of this condition is not known. What increases the risk? The following factors may make you more likely to develop this condition: Hypertension. Having abnormal blood vessels present since birth (congenital abnormality). Bleeding disorders, such as hemophilia, sickle cell disease, or liver disease. Blood thinners (anticoagulants) that make the blood too thin. Being an older adult. Moderate or heavy alcohol use. Using drugs, such as cocaine or methamphetamines. What are the signs or symptoms? Symptoms of this condition include: The sudden onset of: Weakness or numbness of the face, arm, or leg, especially on one side of the body. Confusion. Trouble speaking or understanding speech. Difficulty seeing in one or both eyes. Difficulty walking or moving the arms or legs. Dizziness, or loss of balance or coordination. Nausea and vomiting. A severe headache with no known cause. This headache may feel like the worst one a person has ever had. Seizures. How is this diagnosed? This condition may be diagnosed based on: Your symptoms, medical history, and a physical exam. Tests, including: Blood tests. CT scan. MRI. CT angiography (CTA) or magnetic resonance angiography (MRA). Catheter angiogram. In this procedure, dye is injected through a long, thin tube (catheter) into one of your arteries. X-rays are taken and will show whether a blockage or a problem in a blood vessel exists. How is this treated? The goals of treatment are to stop the bleeding, reduce pressure on the brain, relieve symptoms, and prevent complications. Treatment may include: Medicines that do the following: Lower blood pressure (antihypertensives). Relieve pain (analgesics),  fever, nausea, or vomiting. Stop or prevent seizures (anticonvulsants). Prevent blood vessels in the brain from spasming in  response to bleeding. Control bleeding in the brain. Use of a machine to help you breathe (ventilator). A blood transfusion to help your blood clot. Placement of a tube (shunt) in the brain to relieve pressure. Physical, speech, or occupational therapy. Surgery to stop the bleeding, remove a blood clot or tumor, or reduce pressure. Treatment depends on the cause, severity, and duration of symptoms. Talk with your health care provider about what to expect during your recovery. Follow these instructions at home: Activity Use a walker or a cane as told by your health care provider. Return to your normal activities as told by your health care provider. Ask your health care provider what activities are safe for you. Rest to help your brain heal. Make sure you: Get plenty of sleep. Avoid staying up late at night. Keep to a sleep schedule. Go to sleep and wake up at about the same time every day. Avoid activities that cause physical or mental stress. Lifestyle Do not drink alcohol if: Your health care provider tells you not to drink. You are pregnant, may be pregnant, or are planning to become pregnant. If you drink alcohol: Limit how much you use to: 0-1 drink a day for women. 0-2 drinks a day for men. Know how much alcohol is in your drink. In the [place] one drink equals one 12 oz bottle of beer (355 mL), one 5 oz glass of wine (148 mL), or one 1 oz glass of hard liquor (44 mL). General instructions Do not use any products that contain nicotine or tobacco. These include cigarettes, chewing tobacco, and vaping devices, such as e-cigarettes. If you need help quitting, ask your health care provider. Do not drive or use heavy machinery until your health care provider approves. Take over-the-counter and prescription medicines only as told by your health care provider. Keep all follow-up visits, including those with therapists. This is important. How is this prevented? You can reduce your risk of  stroke by managing conditions, such as: High blood pressure. High cholesterol. Diabetes. Heart disease. Obesity. Other factors and lifestyle changes that can lower your risk include: Quitting smoking, limiting alcohol, and staying physically active. Having your bloodwork monitored frequently if you take anticoagulants (blood thinners). Contact a health care provider if: You develop any of the following symptoms: Headaches that keep coming back (that are chronic). Nausea. Vision problems. Increased sensitivity to noise or light. Depression, mood swings, anxiety, or irritability. Memory problems. Difficulty concentrating or paying attention. Sleep problems. Feeling tired all of the time. Get help right away if: You have a partial or total loss of consciousness. You are taking blood thinners and you fall or have a minor injury to the head. You have a bleeding disorder and you fall, or you have a minor trauma to the head. You have any symptoms of a stroke. "BE FAST" is an easy way to remember the main warning signs of a stroke: B - Balance. Signs are dizziness, sudden trouble walking, or loss of balance. E - Eyes. Signs are trouble seeing or a sudden change in vision. F - Face. Signs are sudden weakness or numbness of the face, or the face or eyelid drooping on one side. A - Arms. Signs are weakness or numbness in an arm. This happens suddenly and usually on one side of the body. S - Speech. Signs are sudden trouble speaking, slurred speech, or trouble  understanding what people say. T - Time. Time to call emergency services. Write down what time symptoms started. You have other signs of a stroke, such as: A sudden, severe headache with no known cause. Nausea or vomiting. Seizure. These symptoms may represent a serious problem that is an emergency. Do not wait to see if the symptoms will go away. Get medical help right away. Call your local emergency services (911 in the [place] Do not  drive yourself to the hospital. Summary Hemorrhagic stroke is caused by bleeding in or around the brain. Hemorrhagic stroke is a medical emergency. Know the signs and symptoms of stroke. You can reduce your risk of stroke by managing conditions, quitting smoking, and making other lifestyle changes. This information is not intended to replace advice given to you by your health care provider. Make sure you discuss any questions you have with your health care provider. Document Revised: 07/09/2020 Document Reviewed: 07/09/2020 Elsevier Patient Education  2022 [employer].

## 2021-09-23 NOTE — Progress Notes (Signed)
[facility] [address] [place]. [place] [address] [phone]       HOSPITAL FOLLOW UP NOTE  [name] Date of Birth:  1946/04/22 Medical Record Number:  [id number]   Reason for Referral:  hospital stroke follow up    SUBJECTIVE:   CHIEF COMPLAINT:  Chief Complaint  Patient presents with   Follow-up    Rm 2 with daughter here for hospital follow up- reports she has been doing well no concerns.     HPI:   Dominique Kirk is a 75 yo female patient with a history of HTN, HLD and diabetes who presented on 08/18/2021 after a fall at home on 10/29.  After the fall, she developed some generalized fatigue and noticed that she was moving more slowly. On the day of admission, she reached out to her PCP who advised her to proceed to ED for evaluation.  Personally reviewed hospitalization pertinent progress notes, lab work and imaging.  Evaluated by 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  Carotid Dopplers unremarkable.  EF 65 to 70%.  LDL 58.  A1c 5.4.  Found to be hypertensive on admission on Cleviprex for BP control and discharged on amlodipine and hydralazine.  No antithrombotic recommended due to [place].  No prior stroke history.  PT/OT recommended [facility] PT/OT and discharged home.   Today, 09/23/2021, patient being seen for initial hospital follow-up accompanied by her daughter.  Stable since discharge -denies new stroke/TIA symptoms Continues to work with [facility] therapies - working on generalized strengthening  Has been improving. No assistive device. No recent falls. Lives with daughter - doing ADLs independently.   Compliant on Crestor -denies side effects.  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  Social History:  Social History   Socioeconomic History   Marital status: Divorced    Spouse name: Not on file   Number of children: Not on file   Years of education: Not on file   Highest education level: Not on file  Occupational History   Not on file  Tobacco Use   Smoking status: Some Days    Types: Cigarettes   Smokeless tobacco: Never  Substance and Sexual Activity   Alcohol use: Never   Drug use: Never   Sexual activity: Not on file  Other Topics Concern   Not on file  Social History Narrative   Not on file   Social Determinants of Health   Financial Resource  Strain: Not on file  Food Insecurity: Not on file  Transportation Needs: Not on file  Physical Activity: Not on file  Stress: Not on file  Social Connections: Not on file  Intimate Partner Violence: Not on file    Family History: No family history on file.  Medications:   Current Outpatient Medications on File Prior to Visit  Medication Sig Dispense Refill   acetaminophen (TYLENOL) 500 MG tablet Take 1,000 mg by mouth every 6 (six) hours as needed for mild pain.     amLODipine (NORVASC) 10 MG tablet Take 1 tablet (10 mg total) by mouth daily. 30 tablet 0   ferrous sulfate 325  (65 FE) MG tablet Take 325 mg by mouth daily with breakfast.     hydrALAZINE (APRESOLINE) 50 MG tablet Take 1 tablet (50 mg total) by mouth every 8 (eight) hours. 120 tablet 1   NOVOLIN 70/30 KWIKPEN (70-30) 100 UNIT/ML KwikPen Inject 12-14 Units into the skin See admin instructions. Inject 12 units under the skin in the morning, and 14 units under the skin at bedtime     rosuvastatin (CRESTOR) 20 MG tablet Take 20 mg by mouth at bedtime.     Turmeric (QC TUMERIC COMPLEX PO) Take 1 capsule by mouth daily.     No current facility-administered medications on file prior to visit.    Allergies:  No Known Allergies    OBJECTIVE:  Physical Exam  Vitals:   09/23/21 1302  BP: 132/60  Pulse: 95  SpO2: 96%  Weight: 171 lb (77.6 kg)  Height: 4\' 11"  (1.499 m)   Body mass index is 34.54 kg/m. No results found.  Post stroke PHQ 2/9 Depression screen PHQ 2/9 09/23/2021  Decreased Interest 0  Down, Depressed, Hopeless 0  PHQ - 2 Score 0     General: well developed, well nourished, very pleasant elderly African-American female, seated, in no evident distress Head: head normocephalic and atraumatic.   Neck: supple with no carotid or supraclavicular bruits Cardiovascular: regular rate and rhythm, no murmurs Musculoskeletal: no deformity Skin:  no rash/petichiae Vascular:  Normal pulses all extremities   Neurologic Exam Mental Status: Awake and fully alert.  Fluent speech and language.  Oriented to place and time. Recent and remote memory intact. Attention span, concentration and fund of knowledge appropriate. Mood and affect appropriate.  Cranial Nerves: Fundoscopic exam reveals sharp disc margins. Pupils equal, briskly reactive to light. Extraocular movements full without nystagmus. Visual fields full to confrontation. Hearing intact. Facial sensation intact. Face, tongue, palate moves normally and symmetrically.  Motor: Normal bulk and tone. Normal strength in all tested extremity  muscles except mild bilateral hip flexor weakness Sensory.: intact to touch , pinprick , position and vibratory sensation.  Coordination: Rapid alternating movements normal in all extremities. Finger-to-nose and heel-to-shin performed accurately bilaterally. Gait and Station: Arises from chair without difficulty. Stance is normal. Gait demonstrates normal stride length and mild imbalance/unsteadiness without use of AD.  Unable to complete tandem walk and heel toe .  Reflexes: 1+ and symmetric. Toes downgoing.     NIHSS  0 Modified Rankin  2      ASSESSMENT: Dominique Kirk is a 75 y.o. 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 Vascular risk factors include HTN, HLD and DM.      PLAN:  L BG ICH :  continued bilateral hip flexor weakness -continue working with home health therapies.   Continue  Crestor 20 mg daily for secondary stroke prevention No indication for antithrombotic in setting of recent ICH with no prior stroke history or hx of vascular stents Repeat CT head to assess for resolution of ICH Discussed secondary stroke prevention measures and importance of close PCP follow up for aggressive stroke risk factor management. I have gone over the pathophysiology of stroke, warning signs and symptoms, risk factors and their management in some detail with instructions to go to the closest emergency room for symptoms of concern. HTN: BP goal <130/90.  Stable on current regimen per PCP HLD: LDL goal <70. Recent LDL 58 on Crestor 20 mg daily per PCP DMII: A1c goal<7.0. Recent A1c 5.4.  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 No fm hx of aneurysms or personal history, occasional tobacco use (trying to quit) and currently BP in good control. Will plan on repeat MRA around 01/2022 for surveillance monitoring - unable to obtain CTA due to decreased kidney  function    Follow up in 6 months or call earlier if needed   CC:  [place] provider: Dr. Leonie Man PCP: Serita Grammes, MD    I spent 58 minutes of face-to-face and non-face-to-face time with patient and daughter.  This included previsit chart review including review of recent hospitalization, lab review, study review, order entry, electronic health record documentation, patient and daughter education regarding recent stroke including etiology, secondary stroke prevention measures and importance of managing stroke risk factors, residual deficits and typical recovery time and answered all other questions to patient and daughters satisfaction  Frann Rider, AGNP-BC  [facility] [address] [place], [place] [address]  Phone [phone] Fax [phone] Note: This document was prepared with digital dictation and possible smart phrase technology. Any transcriptional errors that result from this process are unintentional.

## 2021-09-26 DIAGNOSIS — Z79899 Other long term (current) drug therapy: Secondary | ICD-10-CM | POA: Diagnosis not present

## 2021-09-26 DIAGNOSIS — Z0000 Encounter for general adult medical examination without abnormal findings: Secondary | ICD-10-CM | POA: Diagnosis not present

## 2021-09-26 DIAGNOSIS — N183 Chronic kidney disease, stage 3 unspecified: Secondary | ICD-10-CM | POA: Diagnosis not present

## 2021-09-26 DIAGNOSIS — E1129 Type 2 diabetes mellitus with other diabetic kidney complication: Secondary | ICD-10-CM | POA: Diagnosis not present

## 2021-09-26 DIAGNOSIS — Z794 Long term (current) use of insulin: Secondary | ICD-10-CM | POA: Diagnosis not present

## 2021-09-26 DIAGNOSIS — E1122 Type 2 diabetes mellitus with diabetic chronic kidney disease: Secondary | ICD-10-CM | POA: Diagnosis not present

## 2021-09-26 DIAGNOSIS — Z6833 Body mass index (BMI) 33.0-33.9, adult: Secondary | ICD-10-CM | POA: Diagnosis not present

## 2021-09-26 DIAGNOSIS — N179 Acute kidney failure, unspecified: Secondary | ICD-10-CM | POA: Diagnosis not present

## 2021-09-26 DIAGNOSIS — I619 Nontraumatic intracerebral hemorrhage, unspecified: Secondary | ICD-10-CM | POA: Diagnosis not present

## 2021-09-26 DIAGNOSIS — M85852 Other specified disorders of bone density and structure, left thigh: Secondary | ICD-10-CM | POA: Diagnosis not present

## 2021-09-26 NOTE — Progress Notes (Signed)
I agree with the above plan 

## 2021-10-08 ENCOUNTER — Ambulatory Visit
Admission: RE | Admit: 2021-10-08 | Discharge: 2021-10-08 | Disposition: A | Discharge status: Home / Self Care | Payer: Medicare HMO | Source: Ambulatory Visit | Attending: Adult Health | Admitting: Adult Health

## 2021-10-08 DIAGNOSIS — G9389 Other specified disorders of brain: Secondary | ICD-10-CM | POA: Diagnosis not present

## 2021-10-08 DIAGNOSIS — G238 Other specified degenerative diseases of basal ganglia: Secondary | ICD-10-CM | POA: Diagnosis not present

## 2021-10-08 DIAGNOSIS — J323 Chronic sphenoidal sinusitis: Secondary | ICD-10-CM | POA: Diagnosis not present

## 2021-10-08 DIAGNOSIS — I619 Nontraumatic intracerebral hemorrhage, unspecified: Secondary | ICD-10-CM | POA: Diagnosis not present

## 2021-10-09 ENCOUNTER — Other Ambulatory Visit: Payer: Medicare HMO

## 2021-10-14 ENCOUNTER — Telehealth: Payer: Self-pay

## 2021-10-14 NOTE — Telephone Encounter (Signed)
I called the pt and advised of results. Pt verbalized understanding and appreciation for the call.

## 2021-10-14 NOTE — Telephone Encounter (Signed)
-----   Message from Frann Rider, NP sent at 10/09/2021  4:25 PM EST ----- Please advise recent CT scan showed resolution of prior bleed. Thank you

## 2021-12-06 DIAGNOSIS — R6889 Other general symptoms and signs: Secondary | ICD-10-CM | POA: Diagnosis not present

## 2021-12-06 DIAGNOSIS — E11649 Type 2 diabetes mellitus with hypoglycemia without coma: Secondary | ICD-10-CM | POA: Diagnosis not present

## 2021-12-06 DIAGNOSIS — R404 Transient alteration of awareness: Secondary | ICD-10-CM | POA: Diagnosis not present

## 2021-12-06 DIAGNOSIS — I499 Cardiac arrhythmia, unspecified: Secondary | ICD-10-CM | POA: Diagnosis not present

## 2021-12-06 DIAGNOSIS — S0511XA Contusion of eyeball and orbital tissues, right eye, initial encounter: Secondary | ICD-10-CM | POA: Diagnosis not present

## 2021-12-06 DIAGNOSIS — E161 Other hypoglycemia: Secondary | ICD-10-CM | POA: Diagnosis not present

## 2021-12-06 DIAGNOSIS — E162 Hypoglycemia, unspecified: Secondary | ICD-10-CM | POA: Diagnosis not present

## 2021-12-06 DIAGNOSIS — Z8673 Personal history of transient ischemic attack (TIA), and cerebral infarction without residual deficits: Secondary | ICD-10-CM | POA: Diagnosis not present

## 2021-12-06 DIAGNOSIS — Z743 Need for continuous supervision: Secondary | ICD-10-CM | POA: Diagnosis not present

## 2021-12-06 DIAGNOSIS — R4182 Altered mental status, unspecified: Secondary | ICD-10-CM | POA: Diagnosis not present

## 2021-12-10 DIAGNOSIS — E1122 Type 2 diabetes mellitus with diabetic chronic kidney disease: Secondary | ICD-10-CM | POA: Diagnosis not present

## 2021-12-10 DIAGNOSIS — Z79899 Other long term (current) drug therapy: Secondary | ICD-10-CM | POA: Diagnosis not present

## 2021-12-10 DIAGNOSIS — W19XXXA Unspecified fall, initial encounter: Secondary | ICD-10-CM | POA: Diagnosis not present

## 2021-12-10 DIAGNOSIS — N184 Chronic kidney disease, stage 4 (severe): Secondary | ICD-10-CM | POA: Diagnosis not present

## 2021-12-10 DIAGNOSIS — R32 Unspecified urinary incontinence: Secondary | ICD-10-CM | POA: Diagnosis not present

## 2021-12-10 DIAGNOSIS — Z0000 Encounter for general adult medical examination without abnormal findings: Secondary | ICD-10-CM | POA: Diagnosis not present

## 2021-12-10 DIAGNOSIS — E782 Mixed hyperlipidemia: Secondary | ICD-10-CM | POA: Diagnosis not present

## 2021-12-10 DIAGNOSIS — E1129 Type 2 diabetes mellitus with other diabetic kidney complication: Secondary | ICD-10-CM | POA: Diagnosis not present

## 2021-12-10 DIAGNOSIS — N3001 Acute cystitis with hematuria: Secondary | ICD-10-CM | POA: Diagnosis not present

## 2021-12-10 DIAGNOSIS — I10 Essential (primary) hypertension: Secondary | ICD-10-CM | POA: Diagnosis not present

## 2021-12-12 DIAGNOSIS — E1129 Type 2 diabetes mellitus with other diabetic kidney complication: Secondary | ICD-10-CM | POA: Diagnosis not present

## 2021-12-25 DIAGNOSIS — I10 Essential (primary) hypertension: Secondary | ICD-10-CM | POA: Diagnosis not present

## 2021-12-25 DIAGNOSIS — D638 Anemia in other chronic diseases classified elsewhere: Secondary | ICD-10-CM | POA: Diagnosis not present

## 2021-12-25 DIAGNOSIS — N184 Chronic kidney disease, stage 4 (severe): Secondary | ICD-10-CM | POA: Diagnosis not present

## 2021-12-25 DIAGNOSIS — E1122 Type 2 diabetes mellitus with diabetic chronic kidney disease: Secondary | ICD-10-CM | POA: Diagnosis not present

## 2021-12-25 DIAGNOSIS — Z6831 Body mass index (BMI) 31.0-31.9, adult: Secondary | ICD-10-CM | POA: Diagnosis not present

## 2021-12-25 DIAGNOSIS — E1129 Type 2 diabetes mellitus with other diabetic kidney complication: Secondary | ICD-10-CM | POA: Diagnosis not present

## 2021-12-25 DIAGNOSIS — E1165 Type 2 diabetes mellitus with hyperglycemia: Secondary | ICD-10-CM | POA: Diagnosis not present

## 2022-01-07 DIAGNOSIS — I129 Hypertensive chronic kidney disease with stage 1 through stage 4 chronic kidney disease, or unspecified chronic kidney disease: Secondary | ICD-10-CM | POA: Diagnosis not present

## 2022-01-07 DIAGNOSIS — E875 Hyperkalemia: Secondary | ICD-10-CM | POA: Diagnosis not present

## 2022-01-07 DIAGNOSIS — M8580 Other specified disorders of bone density and structure, unspecified site: Secondary | ICD-10-CM | POA: Diagnosis not present

## 2022-01-07 DIAGNOSIS — N184 Chronic kidney disease, stage 4 (severe): Secondary | ICD-10-CM | POA: Diagnosis not present

## 2022-01-07 DIAGNOSIS — D631 Anemia in chronic kidney disease: Secondary | ICD-10-CM | POA: Diagnosis not present

## 2022-01-07 DIAGNOSIS — E1122 Type 2 diabetes mellitus with diabetic chronic kidney disease: Secondary | ICD-10-CM | POA: Diagnosis not present

## 2022-01-12 ENCOUNTER — Other Ambulatory Visit: Payer: Self-pay | Admitting: Nephrology

## 2022-01-12 DIAGNOSIS — N184 Chronic kidney disease, stage 4 (severe): Secondary | ICD-10-CM

## 2022-01-13 ENCOUNTER — Encounter: Payer: Medicare HMO | Admitting: Oncology

## 2022-01-13 ENCOUNTER — Inpatient Hospital Stay: Payer: Medicare HMO

## 2022-01-13 NOTE — Progress Notes (Deleted)
?Dominique Kirk  ?[address] ?[place],  [place]  [address] ?[phone] ? ?Clinic Day:  01/13/2022 ? ?Referring physician: Serita Grammes, MD ? ? ?HISTORY OF PRESENT ILLNESS:  ?The patient is a 76 y.o. female  who I was asked to consult upon for iron deficiency anemia.  Recent labs showed a low hemoglobin of ***, with a low MCV of ***.  Iron studies done recently showed a low ferritin of ***, a low serum iron of ***, a TIBC of ***, and a low iron saturation of ***. ? ?The patient***overt forms of blood loss. ?***past colonoscopy, which showed*** ? ?PAST MEDICAL HISTORY:  ? ?Past Medical History:  ?Diagnosis Date  ? Diabetes mellitus without complication ([place])   ? Hyperlipidemia   ? Hypertension   ? ? ?PAST SURGICAL HISTORY:  ?No past surgical history on file. ? ?CURRENT MEDICATIONS:  ? ?Current Outpatient Medications  ?Medication Sig Dispense Refill  ? acetaminophen (TYLENOL) 500 MG tablet Take 1,000 mg by mouth every 6 (six) hours as needed for mild pain.    ? amLODipine (NORVASC) 10 MG tablet Take 1 tablet (10 mg total) by mouth daily. 30 tablet 0  ? ferrous sulfate 325 (65 FE) MG tablet Take 325 mg by mouth daily with breakfast.    ? hydrALAZINE (APRESOLINE) 50 MG tablet Take 1 tablet (50 mg total) by mouth every 8 (eight) hours. 120 tablet 1  ? NOVOLIN 70/30 KWIKPEN (70-30) 100 UNIT/ML KwikPen Inject 12-14 Units into the skin See admin instructions. Inject 12 units under the skin in the morning, and 14 units under the skin at bedtime    ? rosuvastatin (CRESTOR) 20 MG tablet Take 20 mg by mouth at bedtime.    ? Turmeric (QC TUMERIC COMPLEX PO) Take 1 capsule by mouth daily.    ? ?No current facility-administered medications for this visit.  ? ? ?ALLERGIES:  ?No Known Allergies ? ?FAMILY HISTORY:  ?No family history on file. ? ?SOCIAL HISTORY:  ? reports that she has been smoking cigarettes. She has never used smokeless tobacco. She reports that she does not drink alcohol and  does not use drugs. ? ?REVIEW OF SYSTEMS:  ?Review of Systems - Oncology  ? ?PHYSICAL EXAM:  ?There were no vitals taken for this visit. ?Wt Readings from Last 3 Encounters:  ?09/23/21 171 lb (77.6 kg)  ?08/18/21 166 lb (75.3 kg)  ? ?There is no height or weight on file to calculate BMI. ?Performance status (ECOG): {CHL ONC [id number] ?Physical Exam ? ?LABS:  ? ? ?  Latest Ref Rng & Units 08/20/2021  ?  4:45 AM 08/18/2021  ?  4:14 PM  ?CBC  ?WBC 4.0 - 10.5 K/uL 12.7   13.2    ?Hemoglobin 12.0 - 15.0 g/dL 8.5   9.4    ?Hematocrit 36.0 - 46.0 % 26.3   30.3    ?Platelets 150 - 400 K/uL 292   321    ? ? ?  Latest Ref Rng & Units 08/20/2021  ?  4:45 AM 08/18/2021  ?  4:14 PM  ?CMP  ?Glucose 70 - 99 mg/dL 278   239    ?BUN 8 - 23 mg/dL 40   44    ?Creatinine 0.44 - 1.00 mg/dL 2.29   2.60    ?Sodium 135 - 145 mmol/L 135   142    ?Potassium 3.5 - 5.1 mmol/L 4.4   4.2    ?Chloride 98 - 111 mmol/L 108  109    ?CO2 22 - 32 mmol/L 18   20    ?Calcium 8.9 - 10.3 mg/dL 8.7   9.2    ?Total Protein 6.5 - 8.1 g/dL  7.9    ?Total Bilirubin 0.3 - 1.2 mg/dL  0.3    ?Alkaline Phos 38 - 126 U/L  105    ?AST 15 - 41 U/L  16    ?ALT 0 - 44 U/L  17    ? ? ? ?No results found for: CEA1 / No results found for: CEA1 ?No results found for: PSA1 ?No results found for: KAJ681 ?No results found for: LXB262  ?No results found for: TOTALPROTELP, ALBUMINELP, A1GS, A2GS, BETS, BETA2SER, GAMS, MSPIKE, SPEI ?No results found for: TIBC, FERRITIN, IRONPCTSAT ?No results found for: LDH ? ?No results found for: AFPTUMOR, TOTALPROTELP, ALBUMINELP, A1GS, A2GS, BETS, BETA2SER, GAMS, MSPIKE, SPEI, LDH, CEA1, PSA1, IGASERUM, IGGSERUM, IGMSERUM, THGAB, THYROGLB ? ?Review Flowsheet   ? ?    ? View : No data to display.  ?  ?  ?  ?  ?  ? ? ?STUDIES:  ?No results found.  ? ?ASSESSMENT & PLAN:  ?A 76 y.o. female who I was asked to consult upon for iron deficiency anemia.  I will arrange for him/her to receive IV iron over these next few weeks to rapidly replenish  his/her iron stores and normalize his/her hemoglobin.  I will see him/her back in 3 months to reassess her iron and hemoglobin levels to see how well he/she responded to his/her upcoming IV iron.  The patient understands all the plans discussed today and is in agreement with them. ? ?I do appreciate Serita Grammes, MD for his new consult.  ? ?Malayia Spizzirri Macarthur Critchley, MD   ? ? ?  ?

## 2022-01-15 DIAGNOSIS — D638 Anemia in other chronic diseases classified elsewhere: Secondary | ICD-10-CM | POA: Diagnosis not present

## 2022-01-15 DIAGNOSIS — Z6830 Body mass index (BMI) 30.0-30.9, adult: Secondary | ICD-10-CM | POA: Diagnosis not present

## 2022-01-15 DIAGNOSIS — E1165 Type 2 diabetes mellitus with hyperglycemia: Secondary | ICD-10-CM | POA: Diagnosis not present

## 2022-01-15 DIAGNOSIS — I10 Essential (primary) hypertension: Secondary | ICD-10-CM | POA: Diagnosis not present

## 2022-01-15 DIAGNOSIS — N184 Chronic kidney disease, stage 4 (severe): Secondary | ICD-10-CM | POA: Diagnosis not present

## 2022-01-15 DIAGNOSIS — E1122 Type 2 diabetes mellitus with diabetic chronic kidney disease: Secondary | ICD-10-CM | POA: Diagnosis not present

## 2022-01-21 DIAGNOSIS — N189 Chronic kidney disease, unspecified: Secondary | ICD-10-CM | POA: Diagnosis not present

## 2022-01-21 DIAGNOSIS — D631 Anemia in chronic kidney disease: Secondary | ICD-10-CM | POA: Diagnosis not present

## 2022-01-27 ENCOUNTER — Ambulatory Visit
Admission: RE | Admit: 2022-01-27 | Discharge: 2022-01-27 | Disposition: A | Discharge status: Home / Self Care | Payer: Medicare HMO | Source: Ambulatory Visit | Attending: Nephrology | Admitting: Nephrology

## 2022-01-27 DIAGNOSIS — N184 Chronic kidney disease, stage 4 (severe): Secondary | ICD-10-CM | POA: Diagnosis not present

## 2022-03-02 ENCOUNTER — Telehealth: Payer: Self-pay | Admitting: Adult Health

## 2022-03-02 ENCOUNTER — Other Ambulatory Visit: Payer: Self-pay | Admitting: Adult Health

## 2022-03-02 DIAGNOSIS — I671 Cerebral aneurysm, nonruptured: Secondary | ICD-10-CM

## 2022-03-02 NOTE — Telephone Encounter (Signed)
03/02/22 [employer] auth: [id number] exp 03/02/22-04/01/22 sent to [facility] ? ?03/02/22 [employer] medic[employer] auth: [id number] exp 03/02/22-04/01/22 sent to [facility] ?They will call to schedule patient ?

## 2022-03-09 DIAGNOSIS — E875 Hyperkalemia: Secondary | ICD-10-CM | POA: Diagnosis not present

## 2022-03-09 DIAGNOSIS — E1122 Type 2 diabetes mellitus with diabetic chronic kidney disease: Secondary | ICD-10-CM | POA: Diagnosis not present

## 2022-03-09 DIAGNOSIS — I129 Hypertensive chronic kidney disease with stage 1 through stage 4 chronic kidney disease, or unspecified chronic kidney disease: Secondary | ICD-10-CM | POA: Diagnosis not present

## 2022-03-09 DIAGNOSIS — N390 Urinary tract infection, site not specified: Secondary | ICD-10-CM | POA: Diagnosis not present

## 2022-03-09 DIAGNOSIS — M8580 Other specified disorders of bone density and structure, unspecified site: Secondary | ICD-10-CM | POA: Diagnosis not present

## 2022-03-09 DIAGNOSIS — N184 Chronic kidney disease, stage 4 (severe): Secondary | ICD-10-CM | POA: Diagnosis not present

## 2022-03-09 DIAGNOSIS — D631 Anemia in chronic kidney disease: Secondary | ICD-10-CM | POA: Diagnosis not present

## 2022-03-26 ENCOUNTER — Ambulatory Visit: Payer: Medicare HMO | Admitting: Adult Health

## 2022-03-26 ENCOUNTER — Encounter: Payer: Self-pay | Admitting: Adult Health

## 2022-03-26 VITALS — BP 160/60 | HR 98 | Ht 59.0 in | Wt 170.0 lb

## 2022-03-26 DIAGNOSIS — I610 Nontraumatic intracerebral hemorrhage in hemisphere, subcortical: Secondary | ICD-10-CM

## 2022-03-26 DIAGNOSIS — G3184 Mild cognitive impairment, so stated: Secondary | ICD-10-CM | POA: Diagnosis not present

## 2022-03-26 DIAGNOSIS — I671 Cerebral aneurysm, nonruptured: Secondary | ICD-10-CM | POA: Diagnosis not present

## 2022-03-26 NOTE — Patient Instructions (Addendum)
Contact [facility] to schedule vessel imaging (MRA head and neck) to monitor cerebral aneurysm - [phone]  Continue Crestor 20mg  daily  for secondary stroke prevention  Continue to follow up with PCP regarding cholesterol and blood pressure management  Maintain strict control of hypertension with blood pressure goal below 130/90 and cholesterol with LDL cholesterol (bad cholesterol) goal below 70 mg/dL.   Signs of a Stroke? Follow the BEFAST method:  Balance Watch for a sudden loss of balance, trouble with coordination or vertigo Eyes Is there a sudden loss of vision in one or both eyes? Or double vision?  Face: Ask the person to smile. Does one side of the face droop or is it numb?  Arms: Ask the person to raise both arms. Does one arm drift downward? Is there weakness or numbness of a leg? Speech: Ask the person to repeat a simple phrase. Does the speech sound slurred/strange? Is the person confused ? Time: If you observe any of these signs, call 911.     Followup in the future with me in 6 months or call earlier if needed      Thank you for coming to see [place] at [facility]. I hope we have been able to provide you high quality care today.  You may receive a patient satisfaction survey over the next few weeks. We would appreciate your feedback and comments so that we may continue to improve ourselves and the health of our patients.

## 2022-03-26 NOTE — Progress Notes (Signed)
[facility] [address] [place]. [place] [address] [phone]       STROKE FOLLOW UP NOTE  [name] Date of Birth:  [birth year] Medical Record Number:  [id number]   Reason for Referral: stroke follow up    SUBJECTIVE:   CHIEF COMPLAINT:  Chief Complaint  Patient presents with   Follow-up    RM 2 with [name] and sister [name]  Pt is well and stable, no new concerns     HPI:   Update 03/26/2022 JM: Patient returns for 52-month stroke follow-up accompanied by her daughter, Danae Chen, and Sister [name]..  Overall stable from stroke standpoint.  Denies new stroke/TIA symptoms.  She has since completed therapies, her gait and ambulation have improved.  Denies any recent falls.  Daughter does report occasional short term memory difficulties or delayed word finding occasionally.  She is relatively sedentary, watches TV majority of the day.  Only leaves home for doctors appointments.  Continues to live with daughter, maintains ADLs independently.  Daughter assists with majority of IADLs.  Compliant on Crestor, denies side effects.  Blood pressure today elevated, monitors at home and typically around 120s/80s. Continued tobacco use, approx 1-2 cig/day. Currently awaiting to be scheduled for repeat MRA for surveillance monitoring of cerebral aneurysm. Routinely follows with nephrology for CKD.  No further concerns at this time.     History provided for reference purposes only Initial visit 09/23/2021 JM: Patient being seen for initial hospital follow-up accompanied by her daughter.  Stable since discharge -denies new stroke/TIA symptoms Continues to work with [facility] therapies - working on generalized strengthening  Has been improving. No assistive device. No recent falls. Lives with daughter - doing ADLs independently.   Compliant on Crestor -denies side effects.  He Blood pressure today 132/60 - typically stable at home Since stopping meformin, has been  feeling better - no more grogginess or fatigue She has remained on insulin for diabetic management monitored by PCP She does admit to occasional tobacco use but family working on gradually decreasing amount until complete cessation  No further concerns at this time  Stroke admission 08/18/2021 Ms. [name] is a 76 yo female patient with a history of HTN, HLD and diabetes who presented on 08/18/2021 after a fall at home on 10/29.  After the fall, she developed some generalized fatigue and noticed that she was moving more slowly. On the day of admission, she reached out to her PCP who advised her to proceed to ED for evaluation.  Personally reviewed hospitalization pertinent progress notes, lab work and imaging.  Evaluated by 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  Carotid Dopplers unremarkable.  EF 65 to 70%.  LDL 58.  A1c 5.4.  Found to be hypertensive on admission on Cleviprex for BP control and discharged on amlodipine and hydralazine.  No antithrombotic recommended due to [place].  No prior stroke history.  PT/OT recommended [facility] PT/OT and discharged home.     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 No pertinent surgical history.  Social History:  Social History   Socioeconomic History   Marital status: Divorced    Spouse name: Not on file   Number of children: Not on file    Years of education: Not on file   Highest education level: Not on file  Occupational History   Not on file  Tobacco Use   Smoking status: Some Days    Types: Cigarettes   Smokeless tobacco: Never  Substance and Sexual Activity   Alcohol use: Never   Drug use: Never   Sexual activity: Not on file  Other Topics Concern   Not on file  Social History Narrative   Not on file   Social Determinants of Health   Financial Resource Strain: Not on file  Food Insecurity: Not on file  Transportation Needs: Not on file  Physical Activity: Not on file  Stress: Not on file  Social Connections: Not on file  Intimate Partner Violence: Not on file    Family History: History reviewed. No pertinent family history.  Medications:   Current Outpatient Medications on File Prior to Visit  Medication Sig Dispense Refill   amLODipine (NORVASC) 10 MG tablet Take 1 tablet (10 mg total) by mouth daily. 30 tablet 0   FARXIGA 5 MG TABS tablet Take 5 mg by mouth daily.     ferrous sulfate 325 (65 FE) MG tablet Take 325 mg by mouth daily with breakfast.     hydrALAZINE (APRESOLINE) 50 MG tablet Take 1 tablet (50 mg total) by mouth every 8 (eight) hours. 120 tablet 1   rosuvastatin (CRESTOR) 20 MG tablet Take 20 mg by mouth at bedtime.     sodium bicarbonate 650 MG tablet Take 650 mg by mouth 2 (two) times daily.     No current facility-administered medications on file prior to visit.    Allergies:  No Known Allergies    OBJECTIVE:  Physical Exam  Vitals:   03/26/22 1304  BP: (!) 160/60  Pulse: 98  Weight: 170 lb (77.1 kg)  Height: 4\' 11"  (1.499 m)   Body mass index is 34.34 kg/m. No results found.   General: well developed, well nourished, very pleasant elderly African-American female, seated, in no evident distress Head: head normocephalic and atraumatic.   Neck: supple with no carotid or supraclavicular bruits Cardiovascular: regular rate and rhythm, no murmurs Musculoskeletal: no  deformity Skin:  no rash/petichiae Vascular:  Normal pulses all extremities   Neurologic Exam Mental Status: Awake and fully alert.  Fluent speech and language.  Oriented to place and time. Recent memory mildly impaired and remote memory intact. Attention span, concentration and fund of knowledge appropriate. Mood and affect appropriate. Recall 1/3. 4 legged animal naming 7 in 60 seconds. Serial addition good.  Cranial Nerves: Pupils equal, briskly reactive to light. Extraocular movements full without nystagmus. Visual fields full to confrontation. Hearing intact. Facial sensation intact. Face, tongue, palate moves normally and symmetrically.  Motor: Normal bulk and tone. Normal strength in all tested extremity muscles except mild bilateral hip flexor weakness Sensory.: intact to touch , pinprick , position and vibratory sensation.  Coordination: Rapid alternating movements normal in all extremities. Finger-to-nose and heel-to-shin performed accurately bilaterally. Gait and Station: Arises from chair without difficulty. Stance is normal. Gait demonstrates normal stride length and mild imbalance/unsteadiness without use  of AD.  Unable to complete tandem walk and heel toe.  Reflexes: 1+ and symmetric. Toes downgoing.         ASSESSMENT: Dominique Kirk is a 76 y.o. 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 Vascular risk factors include HTN, HLD, DM and tobacco use.      PLAN:  L BG ICH :  Very slight mild bilateral hip flexor weakness.  Encourage increasing daily activity and strengthening exercises at home.  Discussed fall precautions. Continue Crestor 20 mg daily for secondary stroke prevention No indication for antithrombotic in setting of recent [place] with no prior stroke history or hx of vascular stents Repeat [facility] 09/2021 showed resolved left BG hemorrhage Discussed secondary stroke prevention measures and  importance of close PCP follow up for aggressive stroke risk factor management including BP goal<130/90, HLD with LDL goal<70 and DM with A1c.<7.  Discussed importance of complete tobacco cessation for which she verbalizes understanding Reports routine lab work by PCP -unable to view via epic I have gone over the pathophysiology of stroke, warning signs and symptoms, risk factors and their management in some detail with instructions to go to the closest emergency room for symptoms of concern.  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  Discussed importance of cognitive exercises routinely as well as ensuring a healthy diet, adequate sleep and routine exercise.  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  unable to obtain CTA due to impaired kidney function    Follow up in 6 months or call earlier if needed   CC:  PCP: Serita Grammes, MD    I spent 36 minutes of face-to-face and non-face-to-face time with patient and family.  This included previsit chart review, lab review, study review, electronic health record documentation, patient and family education regarding prior stroke with residual deficits, secondary stroke prevention measures and importance of managing stroke risk factors, memory concerns and answered all other questions to patient and family's satisfaction  Frann Rider, [facility]  [facility] [address] [place], [place] [address]  Phone [phone] Fax [phone] Note: This document was prepared with digital dictation and possible smart phrase  technology. Any transcriptional errors that result from this process are unintentional.

## 2022-04-01 ENCOUNTER — Other Ambulatory Visit: Payer: Self-pay | Admitting: Adult Health

## 2022-04-01 DIAGNOSIS — I671 Cerebral aneurysm, nonruptured: Secondary | ICD-10-CM

## 2022-04-07 NOTE — Telephone Encounter (Signed)
MRA neck updated Auth: [id number] exp. 04/07/22-05/07/22 MRA head updated Auth: [id number] exp. 04/07/22-05/07/22

## 2022-04-14 ENCOUNTER — Ambulatory Visit
Admission: RE | Admit: 2022-04-14 | Discharge: 2022-04-14 | Disposition: A | Discharge status: Home / Self Care | Payer: Medicare HMO | Source: Ambulatory Visit | Attending: Adult Health | Admitting: Adult Health

## 2022-04-14 DIAGNOSIS — I671 Cerebral aneurysm, nonruptured: Secondary | ICD-10-CM

## 2022-04-16 ENCOUNTER — Telehealth: Payer: Self-pay

## 2022-04-16 NOTE — Telephone Encounter (Signed)
Contacted pt, went over results. Advised to call office back with questions, she was appreciative.

## 2022-04-16 NOTE — Telephone Encounter (Signed)
-----   Message from Frann Rider, NP sent at 04/16/2022  7:56 AM EDT ----- Please advise patient that recent imaging showed stable appearance of small right ICA aneurysm. Will plan on repeat imaging in 1 year. Thank you.

## 2022-04-20 DIAGNOSIS — E1129 Type 2 diabetes mellitus with other diabetic kidney complication: Secondary | ICD-10-CM | POA: Diagnosis not present

## 2022-04-20 DIAGNOSIS — E1122 Type 2 diabetes mellitus with diabetic chronic kidney disease: Secondary | ICD-10-CM | POA: Diagnosis not present

## 2022-04-20 DIAGNOSIS — N179 Acute kidney failure, unspecified: Secondary | ICD-10-CM | POA: Diagnosis not present

## 2022-04-20 DIAGNOSIS — Z6828 Body mass index (BMI) 28.0-28.9, adult: Secondary | ICD-10-CM | POA: Diagnosis not present

## 2022-04-20 DIAGNOSIS — R634 Abnormal weight loss: Secondary | ICD-10-CM | POA: Diagnosis not present

## 2022-04-20 DIAGNOSIS — N184 Chronic kidney disease, stage 4 (severe): Secondary | ICD-10-CM | POA: Diagnosis not present

## 2022-05-18 DIAGNOSIS — I10 Essential (primary) hypertension: Secondary | ICD-10-CM | POA: Diagnosis not present

## 2022-05-18 DIAGNOSIS — E1165 Type 2 diabetes mellitus with hyperglycemia: Secondary | ICD-10-CM | POA: Diagnosis not present

## 2022-06-18 DIAGNOSIS — E1165 Type 2 diabetes mellitus with hyperglycemia: Secondary | ICD-10-CM | POA: Diagnosis not present

## 2022-06-18 DIAGNOSIS — I10 Essential (primary) hypertension: Secondary | ICD-10-CM | POA: Diagnosis not present

## 2022-08-17 DIAGNOSIS — R32 Unspecified urinary incontinence: Secondary | ICD-10-CM | POA: Diagnosis not present

## 2022-08-17 DIAGNOSIS — Z6827 Body mass index (BMI) 27.0-27.9, adult: Secondary | ICD-10-CM | POA: Diagnosis not present

## 2022-08-17 DIAGNOSIS — Z79899 Other long term (current) drug therapy: Secondary | ICD-10-CM | POA: Diagnosis not present

## 2022-08-17 DIAGNOSIS — E1122 Type 2 diabetes mellitus with diabetic chronic kidney disease: Secondary | ICD-10-CM | POA: Diagnosis not present

## 2022-08-17 DIAGNOSIS — N184 Chronic kidney disease, stage 4 (severe): Secondary | ICD-10-CM | POA: Diagnosis not present

## 2022-08-17 DIAGNOSIS — I10 Essential (primary) hypertension: Secondary | ICD-10-CM | POA: Diagnosis not present

## 2022-08-17 DIAGNOSIS — E1165 Type 2 diabetes mellitus with hyperglycemia: Secondary | ICD-10-CM | POA: Diagnosis not present

## 2022-08-17 DIAGNOSIS — Z23 Encounter for immunization: Secondary | ICD-10-CM | POA: Diagnosis not present

## 2022-08-18 DIAGNOSIS — E1165 Type 2 diabetes mellitus with hyperglycemia: Secondary | ICD-10-CM | POA: Diagnosis not present

## 2022-08-18 DIAGNOSIS — I10 Essential (primary) hypertension: Secondary | ICD-10-CM | POA: Diagnosis not present

## 2022-09-28 NOTE — Progress Notes (Unsigned)
[facility] [address] [place]. [place] [address] [phone]       STROKE FOLLOW UP NOTE  Dominique Kirk Date of Birth:  [birth year] Medical Record Number:  [id number]   Reason for Referral: stroke follow up    SUBJECTIVE:   CHIEF COMPLAINT:  No chief complaint on file.   HPI:   Update 09/29/2022 JM: Patient returns for 87-month stroke follow-up accompanied by ***.  Overall stable, denies new stroke/TIA symptoms. Gait ***. Cognition ***.   Compliant on Crestor Blood pressure well-controlled Tobacco use ***       History provided for reference purposes only Update 03/26/2022 JM: Patient returns for 77-month stroke follow-up accompanied by her daughter, Dominique Kirk, and Sister Dominique Kirk..  Overall stable from stroke standpoint.  Denies new stroke/TIA symptoms.  She has since completed therapies, her gait and ambulation have improved.  Denies any recent falls.  Daughter does report occasional short term memory difficulties or delayed word finding occasionally.  She is relatively sedentary, watches TV majority of the day.  Only leaves home for doctors appointments.  Continues to live with daughter, maintains ADLs independently.  Daughter assists with majority of IADLs.  Compliant on Crestor, denies side effects.  Blood pressure today elevated, monitors at home and typically around 120s/80s. Continued tobacco use, approx 1-2 cig/day. Currently awaiting to be scheduled for repeat MRA for surveillance monitoring of cerebral aneurysm. Routinely follows with nephrology for CKD.  No further concerns at this time.  Initial visit 09/23/2021 JM: Patient being seen for initial hospital follow-up accompanied by her daughter.  Stable since discharge -denies new stroke/TIA symptoms Continues to work with [facility] therapies - working on generalized strengthening  Has been improving. No assistive device. No recent falls. Lives with daughter - doing ADLs independently.   Compliant on  Crestor -denies side effects.  He Blood pressure today 132/60 - typically stable at home Since stopping meformin, has been feeling better - no more grogginess or fatigue She has remained on insulin for diabetic management monitored by PCP She does admit to occasional tobacco use but family working on gradually decreasing amount until complete cessation  No further concerns at this time  Stroke admission 08/18/2021 Dominique Kirk is a 76 yo female patient with a history of HTN, HLD and diabetes who presented on 08/18/2021 after a fall at home on 10/29.  After the fall, she developed some generalized fatigue and noticed that she was moving more slowly. On the day of admission, she reached out to her PCP who advised her to proceed to ED for evaluation.  Personally reviewed hospitalization pertinent progress notes, lab work and imaging.  Evaluated by 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  Carotid Dopplers unremarkable.  EF 65 to 70%.  LDL 58.  A1c 5.4.  Found to be hypertensive on admission on Cleviprex for BP control and discharged on amlodipine and hydralazine.  No antithrombotic recommended due to [place].  No prior stroke history.  PT/OT recommended [facility] PT/OT and discharged home.     PERTINENT IMAGING  CT head left anterior BG ICH MRI acute or subacute hemorrhage in left basal ganglia MRA  no LVO or hemodynamically significant stenosis, 40mm aneurysm on distal right ICA Carotid Doppler unremarkable 2D Echo EF 65 to 70% LDL 58 HgbA1c 5.4    ROS:   14 system review of systems performed  and negative with exception of those listed in HPI  PMH:  Past Medical History:  Diagnosis Date   Diabetes mellitus without complication ([place])    Hyperlipidemia    Hypertension     PSH: No past surgical history on file.  Social History:  Social History    Socioeconomic History   Marital status: Divorced    Spouse name: Not on file   Number of children: Not on file   Years of education: Not on file   Highest education level: Not on file  Occupational History   Not on file  Tobacco Use   Smoking status: Some Days    Types: Cigarettes   Smokeless tobacco: Never  Substance and Sexual Activity   Alcohol use: Never   Drug use: Never   Sexual activity: Not on file  Other Topics Concern   Not on file  Social History Narrative   Not on file   Social Determinants of Health   Financial Resource Strain: Not on file  Food Insecurity: Not on file  Transportation Needs: Not on file  Physical Activity: Not on file  Stress: Not on file  Social Connections: Not on file  Intimate Partner Violence: Not on file    Family History: No family history on file.  Medications:   Current Outpatient Medications on File Prior to Visit  Medication Sig Dispense Refill   amLODipine (NORVASC) 10 MG tablet Take 1 tablet (10 mg total) by mouth daily. 30 tablet 0   FARXIGA 5 MG TABS tablet Take 5 mg by mouth daily.     ferrous sulfate 325 (65 FE) MG tablet Take 325 mg by mouth daily with breakfast.     hydrALAZINE (APRESOLINE) 50 MG tablet Take 1 tablet (50 mg total) by mouth every 8 (eight) hours. 120 tablet 1   rosuvastatin (CRESTOR) 20 MG tablet Take 20 mg by mouth at bedtime.     sodium bicarbonate 650 MG tablet Take 650 mg by mouth 2 (two) times daily.     No current facility-administered medications on file prior to visit.    Allergies:  No Known Allergies    OBJECTIVE:  Physical Exam  There were no vitals filed for this visit.  There is no height or weight on file to calculate BMI. No results found.   General: well developed, well nourished, very pleasant elderly African-American female, seated, in no evident distress Head: head normocephalic and atraumatic.   Neck: supple with no carotid or supraclavicular bruits Cardiovascular:  regular rate and rhythm, no murmurs Musculoskeletal: no deformity Skin:  no rash/petichiae Vascular:  Normal pulses all extremities   Neurologic Exam Mental Status: Awake and fully alert.  Fluent speech and language.  Oriented to place and time. Recent memory mildly impaired and remote memory intact. Attention span, concentration and fund of knowledge appropriate. Mood and affect appropriate. Recall 1/3. 4 legged animal naming 7 in 60 seconds. Serial addition good.  Cranial Nerves: Pupils equal, briskly reactive to light. Extraocular movements full without nystagmus. Visual fields full to confrontation. Hearing intact. Facial sensation intact. Face, tongue, palate moves normally and symmetrically.  Motor: Normal bulk and tone. Normal strength in all tested extremity muscles except mild bilateral hip flexor weakness Sensory.: intact to touch , pinprick , position and vibratory sensation.  Coordination: Rapid alternating movements normal in all extremities. Finger-to-nose and heel-to-shin performed accurately bilaterally. Gait and Station: Arises from chair without difficulty. Stance is normal. Gait demonstrates normal stride length and mild imbalance/unsteadiness without use of  AD.  Unable to complete tandem walk and heel toe.  Reflexes: 1+ and symmetric. Toes downgoing.         ASSESSMENT: Tanith Dagostino is a 76 y.o. 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 Vascular risk factors include HTN, HLD, DM and tobacco use.      PLAN:  L BG ICH :  Very slight mild bilateral hip flexor weakness.  Encourage increasing daily activity and strengthening exercises at home.  Discussed fall precautions. Continue Crestor 20 mg daily for secondary stroke prevention No indication for antithrombotic in setting of recent [place] with no prior stroke history or hx of vascular stents Repeat [facility] 09/2021 showed resolved left BG  hemorrhage Discussed secondary stroke prevention measures and importance of close PCP follow up for aggressive stroke risk factor management including BP goal<130/90, HLD with LDL goal<70 and DM with A1c.<7.  Discussed importance of complete tobacco cessation for which she verbalizes understanding Reports routine lab work by PCP -unable to view via epic I have gone over the pathophysiology of stroke, warning signs and symptoms, risk factors and their management in some detail with instructions to go to the closest emergency room for symptoms of concern.  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  Discussed importance of cognitive exercises routinely as well as ensuring a healthy diet, adequate sleep and routine exercise.  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  unable to obtain CTA due to impaired kidney function    Follow up in 6 months or call earlier if needed   CC:  PCP: Serita Grammes, MD    I spent 36 minutes of face-to-face and non-face-to-face time with patient and family.  This included previsit chart review, lab review, study review, electronic health record documentation, patient and family education regarding prior stroke with residual deficits, secondary stroke prevention measures and importance of managing stroke risk factors, memory concerns and answered all other questions to patient and family's satisfaction  Frann Rider, [facility]  [facility] [address] [place], [place] [address]  Phone [phone] Fax [phone] Note: This document was prepared with digital dictation and possible smart phrase technology. Any  transcriptional errors that result from this process are unintentional.

## 2022-09-29 ENCOUNTER — Encounter: Payer: Self-pay | Admitting: Adult Health

## 2022-09-29 ENCOUNTER — Ambulatory Visit: Payer: Medicare HMO | Admitting: Adult Health

## 2022-09-29 VITALS — BP 182/77 | HR 82 | Ht 59.0 in | Wt 138.0 lb

## 2022-09-29 DIAGNOSIS — I610 Nontraumatic intracerebral hemorrhage in hemisphere, subcortical: Secondary | ICD-10-CM

## 2022-09-29 DIAGNOSIS — G3184 Mild cognitive impairment, so stated: Secondary | ICD-10-CM | POA: Diagnosis not present

## 2022-09-29 DIAGNOSIS — I671 Cerebral aneurysm, nonruptured: Secondary | ICD-10-CM

## 2022-09-29 NOTE — Patient Instructions (Signed)
Continue Crestor for secondary stroke prevention  You will be called around 03/2023 for repeat MRA head for surveillance monitoring of cerebral aneurysm   Continue to follow up with PCP regarding blood pressure, cholesterol and diabetes management  Maintain strict control of hypertension with blood pressure goal below 130/90, diabetes with hemoglobin A1c goal below 7.0 % and cholesterol with LDL cholesterol (bad cholesterol) goal below 70 mg/dL.   Signs of a Stroke? Follow the BEFAST method:  Balance Watch for a sudden loss of balance, trouble with coordination or vertigo Eyes Is there a sudden loss of vision in one or both eyes? Or double vision?  Face: Ask the person to smile. Does one side of the face droop or is it numb?  Arms: Ask the person to raise both arms. Does one arm drift downward? Is there weakness or numbness of a leg? Speech: Ask the person to repeat a simple phrase. Does the speech sound slurred/strange? Is the person confused ? Time: If you observe any of these signs, call 911.     Followup in the future with me in 1 year or call earlier if needed      Thank you for coming to see [place] at [facility]. I hope we have been able to provide you high quality care today.  You may receive a patient satisfaction survey over the next few weeks. We would appreciate your feedback and comments so that we may continue to improve ourselves and the health of our patients.

## 2022-11-18 DIAGNOSIS — I10 Essential (primary) hypertension: Secondary | ICD-10-CM | POA: Diagnosis not present

## 2022-11-18 DIAGNOSIS — E782 Mixed hyperlipidemia: Secondary | ICD-10-CM | POA: Diagnosis not present

## 2022-11-18 DIAGNOSIS — E1122 Type 2 diabetes mellitus with diabetic chronic kidney disease: Secondary | ICD-10-CM | POA: Diagnosis not present

## 2022-11-18 DIAGNOSIS — N184 Chronic kidney disease, stage 4 (severe): Secondary | ICD-10-CM | POA: Diagnosis not present

## 2022-12-14 DIAGNOSIS — E1129 Type 2 diabetes mellitus with other diabetic kidney complication: Secondary | ICD-10-CM | POA: Diagnosis not present

## 2022-12-14 DIAGNOSIS — F0150 Vascular dementia without behavioral disturbance: Secondary | ICD-10-CM | POA: Diagnosis not present

## 2022-12-14 DIAGNOSIS — E1122 Type 2 diabetes mellitus with diabetic chronic kidney disease: Secondary | ICD-10-CM | POA: Diagnosis not present

## 2022-12-14 DIAGNOSIS — Z0000 Encounter for general adult medical examination without abnormal findings: Secondary | ICD-10-CM | POA: Diagnosis not present

## 2022-12-14 DIAGNOSIS — Z79899 Other long term (current) drug therapy: Secondary | ICD-10-CM | POA: Diagnosis not present

## 2022-12-14 DIAGNOSIS — E785 Hyperlipidemia, unspecified: Secondary | ICD-10-CM | POA: Diagnosis not present

## 2022-12-14 DIAGNOSIS — N184 Chronic kidney disease, stage 4 (severe): Secondary | ICD-10-CM | POA: Diagnosis not present

## 2022-12-14 DIAGNOSIS — Z1331 Encounter for screening for depression: Secondary | ICD-10-CM | POA: Diagnosis not present

## 2023-01-18 ENCOUNTER — Other Ambulatory Visit: Payer: Self-pay | Admitting: Adult Health

## 2023-01-18 DIAGNOSIS — I671 Cerebral aneurysm, nonruptured: Secondary | ICD-10-CM

## 2023-03-02 DIAGNOSIS — E119 Type 2 diabetes mellitus without complications: Secondary | ICD-10-CM | POA: Diagnosis not present

## 2023-03-02 DIAGNOSIS — H25813 Combined forms of age-related cataract, bilateral: Secondary | ICD-10-CM | POA: Diagnosis not present

## 2023-03-02 DIAGNOSIS — H5203 Hypermetropia, bilateral: Secondary | ICD-10-CM | POA: Diagnosis not present

## 2023-03-02 DIAGNOSIS — Z794 Long term (current) use of insulin: Secondary | ICD-10-CM | POA: Diagnosis not present

## 2023-03-02 DIAGNOSIS — H52223 Regular astigmatism, bilateral: Secondary | ICD-10-CM | POA: Diagnosis not present

## 2023-03-11 DIAGNOSIS — N189 Chronic kidney disease, unspecified: Secondary | ICD-10-CM | POA: Diagnosis not present

## 2023-03-11 DIAGNOSIS — N184 Chronic kidney disease, stage 4 (severe): Secondary | ICD-10-CM | POA: Diagnosis not present

## 2023-03-11 DIAGNOSIS — R809 Proteinuria, unspecified: Secondary | ICD-10-CM | POA: Diagnosis not present

## 2023-03-11 DIAGNOSIS — E875 Hyperkalemia: Secondary | ICD-10-CM | POA: Diagnosis not present

## 2023-03-11 DIAGNOSIS — I129 Hypertensive chronic kidney disease with stage 1 through stage 4 chronic kidney disease, or unspecified chronic kidney disease: Secondary | ICD-10-CM | POA: Diagnosis not present

## 2023-03-11 DIAGNOSIS — E1122 Type 2 diabetes mellitus with diabetic chronic kidney disease: Secondary | ICD-10-CM | POA: Diagnosis not present

## 2023-03-11 DIAGNOSIS — M8580 Other specified disorders of bone density and structure, unspecified site: Secondary | ICD-10-CM | POA: Diagnosis not present

## 2023-03-11 DIAGNOSIS — E1129 Type 2 diabetes mellitus with other diabetic kidney complication: Secondary | ICD-10-CM | POA: Diagnosis not present

## 2023-03-11 DIAGNOSIS — D631 Anemia in chronic kidney disease: Secondary | ICD-10-CM | POA: Diagnosis not present

## 2023-03-16 ENCOUNTER — Telehealth: Payer: Self-pay | Admitting: Adult Health

## 2023-03-16 NOTE — Telephone Encounter (Signed)
Ethlyn Gallery: [id number] exp. 03/16/23-04/15/23 for GI

## 2023-03-19 DIAGNOSIS — E1169 Type 2 diabetes mellitus with other specified complication: Secondary | ICD-10-CM | POA: Diagnosis not present

## 2023-03-19 DIAGNOSIS — E1165 Type 2 diabetes mellitus with hyperglycemia: Secondary | ICD-10-CM | POA: Diagnosis not present

## 2023-03-19 DIAGNOSIS — N184 Chronic kidney disease, stage 4 (severe): Secondary | ICD-10-CM | POA: Diagnosis not present

## 2023-03-19 DIAGNOSIS — E1122 Type 2 diabetes mellitus with diabetic chronic kidney disease: Secondary | ICD-10-CM | POA: Diagnosis not present

## 2023-03-19 DIAGNOSIS — E785 Hyperlipidemia, unspecified: Secondary | ICD-10-CM | POA: Diagnosis not present

## 2023-03-19 DIAGNOSIS — Z6826 Body mass index (BMI) 26.0-26.9, adult: Secondary | ICD-10-CM | POA: Diagnosis not present

## 2023-03-19 DIAGNOSIS — Z1331 Encounter for screening for depression: Secondary | ICD-10-CM | POA: Diagnosis not present

## 2023-03-19 DIAGNOSIS — F0150 Vascular dementia without behavioral disturbance: Secondary | ICD-10-CM | POA: Diagnosis not present

## 2023-04-07 ENCOUNTER — Ambulatory Visit
Admission: RE | Admit: 2023-04-07 | Discharge: 2023-04-07 | Disposition: A | Discharge status: Home / Self Care | Payer: Medicare HMO | Source: Ambulatory Visit | Attending: Adult Health | Admitting: Adult Health

## 2023-04-07 DIAGNOSIS — I671 Cerebral aneurysm, nonruptured: Secondary | ICD-10-CM

## 2023-04-26 ENCOUNTER — Telehealth: Payer: Self-pay

## 2023-04-26 NOTE — Telephone Encounter (Signed)
I spoke with the patient's daughter, Rusty Manser (as per DPR). I provided her with the results of the MRA. She verbalized understanding of the findings and expressed appreciation for the call. Follow up appointment date was relayed to patient. All questions answered.

## 2023-04-26 NOTE — Telephone Encounter (Signed)
-----   Message from Ihor Austin, NP sent at 04/26/2023  9:30 AM EDT ----- Please advise patient that imaging showed stable appearance of right ICA aneurysm. As this has been stable over the past 3 years, we can now start doing imaging every few years but I can discuss this with her more at her next follow up visit. Thank you.

## 2023-06-01 DIAGNOSIS — R809 Proteinuria, unspecified: Secondary | ICD-10-CM | POA: Diagnosis not present

## 2023-06-01 DIAGNOSIS — E1122 Type 2 diabetes mellitus with diabetic chronic kidney disease: Secondary | ICD-10-CM | POA: Diagnosis not present

## 2023-06-01 DIAGNOSIS — E875 Hyperkalemia: Secondary | ICD-10-CM | POA: Diagnosis not present

## 2023-06-01 DIAGNOSIS — M8580 Other specified disorders of bone density and structure, unspecified site: Secondary | ICD-10-CM | POA: Diagnosis not present

## 2023-06-01 DIAGNOSIS — D631 Anemia in chronic kidney disease: Secondary | ICD-10-CM | POA: Diagnosis not present

## 2023-06-01 DIAGNOSIS — E1129 Type 2 diabetes mellitus with other diabetic kidney complication: Secondary | ICD-10-CM | POA: Diagnosis not present

## 2023-06-01 DIAGNOSIS — I129 Hypertensive chronic kidney disease with stage 1 through stage 4 chronic kidney disease, or unspecified chronic kidney disease: Secondary | ICD-10-CM | POA: Diagnosis not present

## 2023-06-01 DIAGNOSIS — N184 Chronic kidney disease, stage 4 (severe): Secondary | ICD-10-CM | POA: Diagnosis not present

## 2023-06-18 DIAGNOSIS — E1159 Type 2 diabetes mellitus with other circulatory complications: Secondary | ICD-10-CM | POA: Diagnosis not present

## 2023-06-18 DIAGNOSIS — F0150 Vascular dementia without behavioral disturbance: Secondary | ICD-10-CM | POA: Diagnosis not present

## 2023-06-18 DIAGNOSIS — Z6827 Body mass index (BMI) 27.0-27.9, adult: Secondary | ICD-10-CM | POA: Diagnosis not present

## 2023-06-18 DIAGNOSIS — N184 Chronic kidney disease, stage 4 (severe): Secondary | ICD-10-CM | POA: Diagnosis not present

## 2023-06-18 DIAGNOSIS — I152 Hypertension secondary to endocrine disorders: Secondary | ICD-10-CM | POA: Diagnosis not present

## 2023-06-18 DIAGNOSIS — E785 Hyperlipidemia, unspecified: Secondary | ICD-10-CM | POA: Diagnosis not present

## 2023-06-18 DIAGNOSIS — E1169 Type 2 diabetes mellitus with other specified complication: Secondary | ICD-10-CM | POA: Diagnosis not present

## 2023-06-18 DIAGNOSIS — E1122 Type 2 diabetes mellitus with diabetic chronic kidney disease: Secondary | ICD-10-CM | POA: Diagnosis not present

## 2023-08-08 IMAGING — MR MR MRA HEAD W/O CM
1 series · 18 of 48 positions shown · non-contrast
Comparison: Prior MRIs available for comparison. Correlation is
made with CT head 08/18/2021

CLINICAL DATA: Cerebral hemorrhage

EXAM:
MRI HEAD WITHOUT CONTRAST
MRA HEAD WITHOUT CONTRAST
TECHNIQUE: Multiplanar, multi-echo pulse sequences of the brain and surrounding
structures were acquired without intravenous contrast. Angiographic
images of the Circle of Willis were acquired using MRA technique
without intravenous contrast.

[Series 2: ax (id) · axial · 1.0mm · 0.43mm/px · z∈[-120,-36]mm · 18 of 180 slices shown]
[im 1/180]
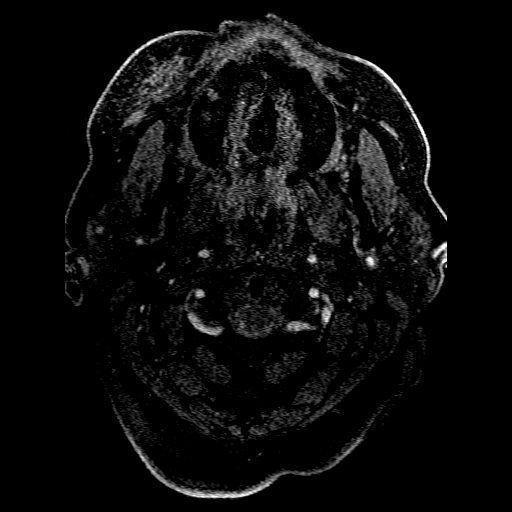
[im 4/180]
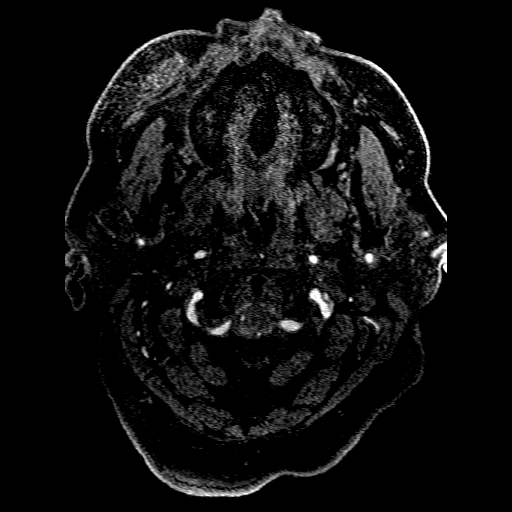
[im 8/180]
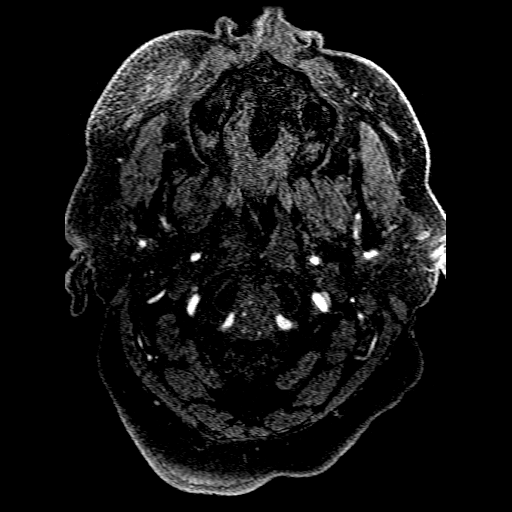
[im 12/180]
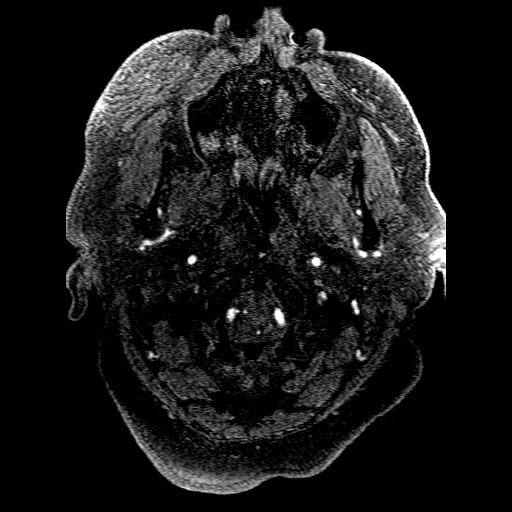
[im 16/180]
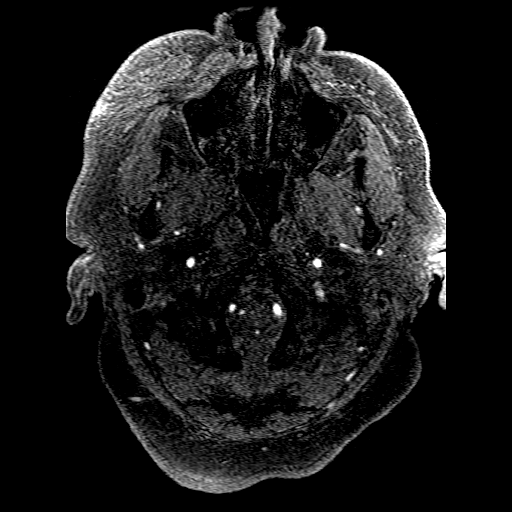
[im 20/180]
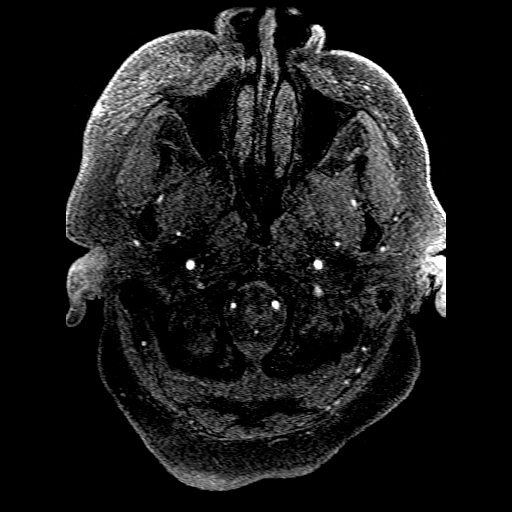
[im 23/180]
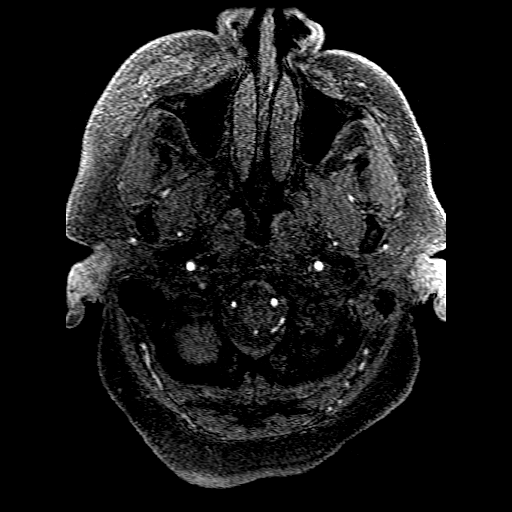
[im 27/180]
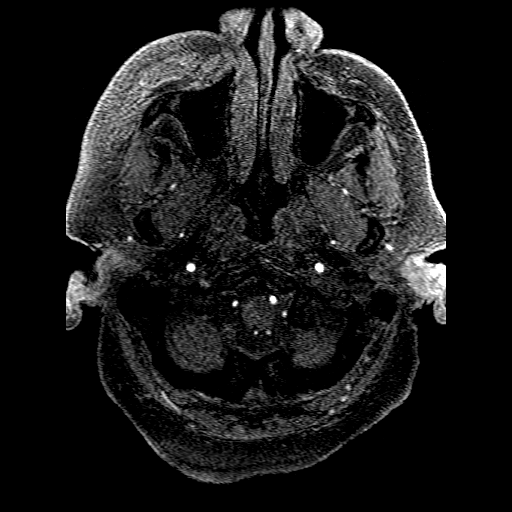
[im 31/180]
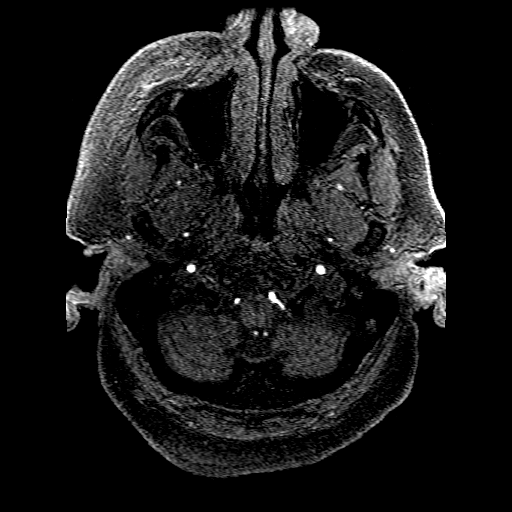
[im 35/180]
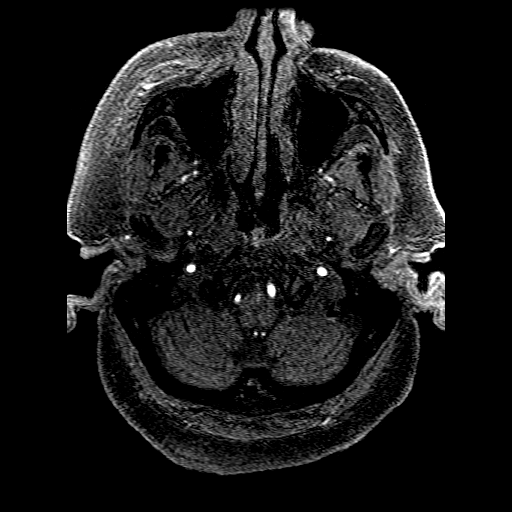
[im 58/180]
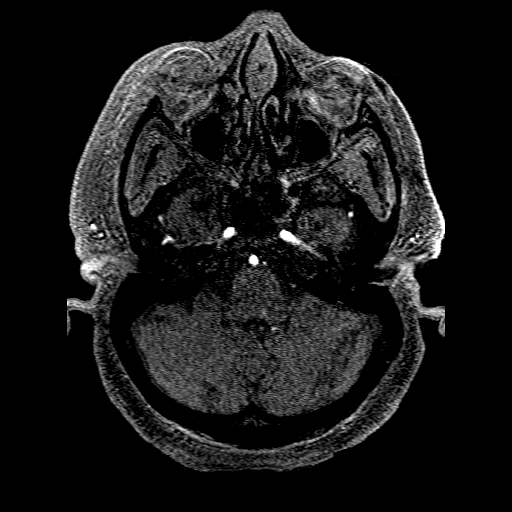
[im 80/180]
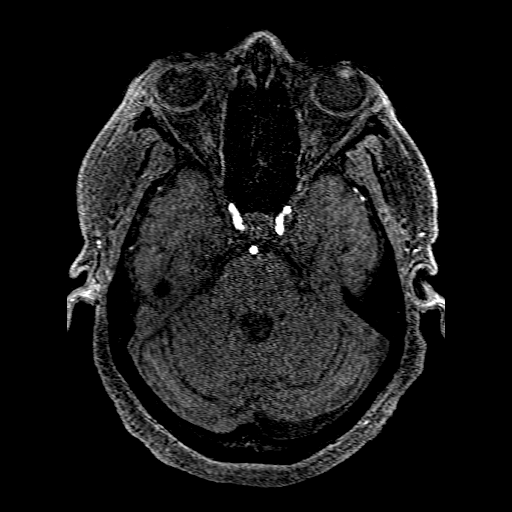
[im 92/180]
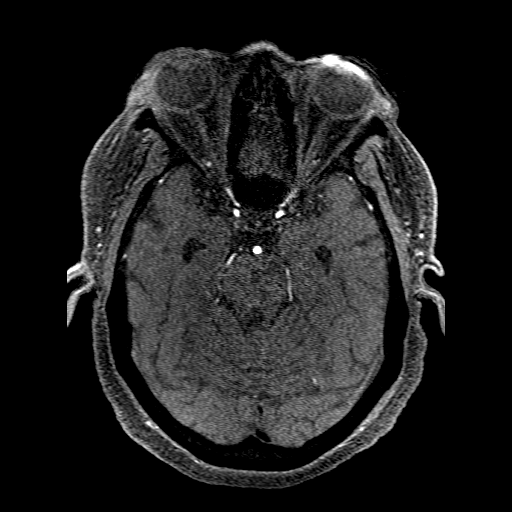
[im 103/180]
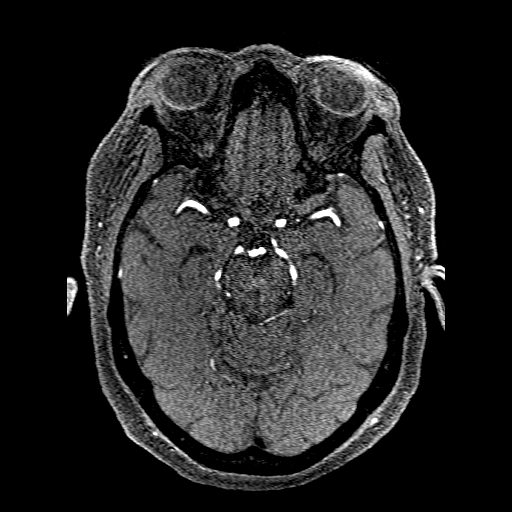
[im 126/180]
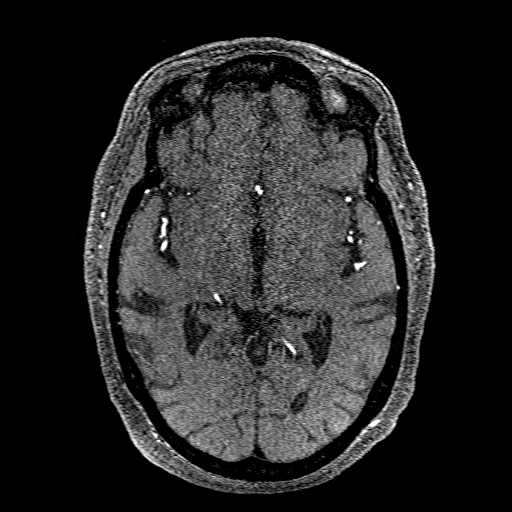
[im 149/180]
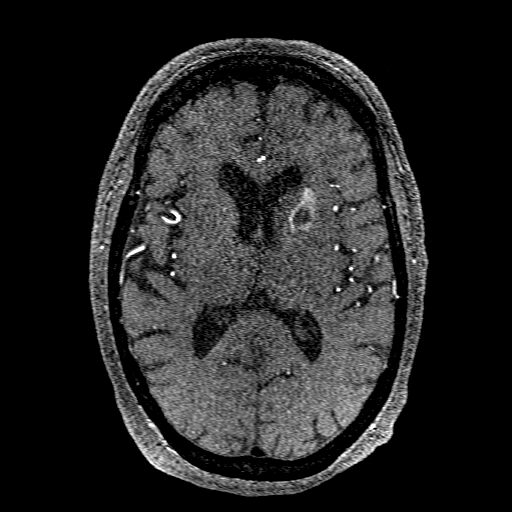
[im 153/180]
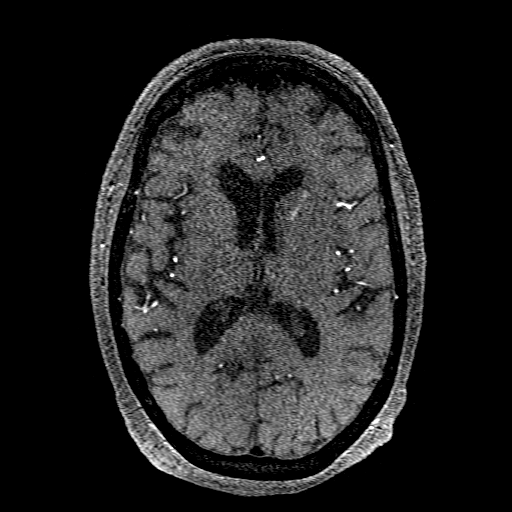
[im 172/180]
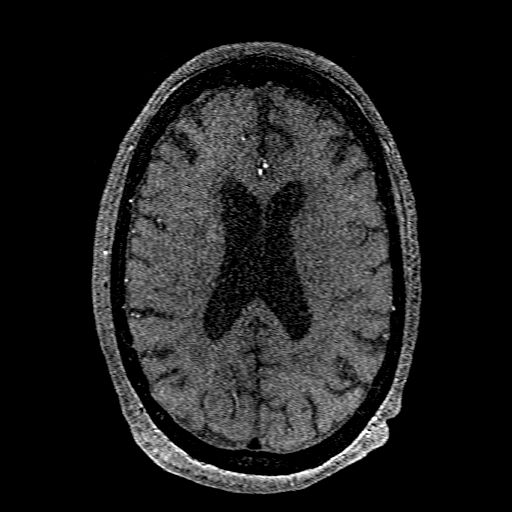

[18 of 48 positions shown; findings below may reference images not displayed]

FINDINGS: MRI HEAD FINDINGS

Brain: Redemonstrated hemorrhage centered in the left basal ganglia,
which measures up to 22 x 9 x 8 mm (series 8, image 25 and series 4,
image 15), likely unchanged from the prior exam. This area
demonstrates T1 hyper and hypointense signal, and T2 hypointense
signal, consistent with acute and subacute blood products.

Mild surrounding increased T2 signal, likely edema. Confluent T2
hyperintense signal in the periventricular white matter and pons,
likely the sequela of severe chronic small vessel ischemic disease.
No additional foci of hemosiderin deposition to suggest remote
hemorrhage.

Vascular: Normal flow voids.

Skull and upper cervical spine: Normal marrow signal.

Sinuses/Orbits: No acute or significant finding.

Other: None.

MRA HEAD FINDINGS

Anterior circulation: Both internal carotid arteries are patent to
the termini. 2 mm anteromedially directed outpouching from the
distal right ICA (series 200, likely a tiny aneurysm. Mild stenosis
in the carotid siphons. A1 segments patent. Normal anterior
communicating artery. Anterior cerebral arteries are patent to their
distal aspects. No M1 stenosis or occlusion. Normal MCA
bifurcations. Distal MCA branches perfused and symmetric.

Posterior circulation: Vertebral arteries patent to the
vertebrobasilar junction, with multifocal stenosis in the distal
right V4 segment (series 2, image 24 and 40). Posterior inferior
cerebral arteries patent bilaterally. Basilar patent to its distal
aspect. Superior cerebral arteries patent bilaterally. PCAs well
perfused to their distal aspects without stenosis. The bilateral
posterior communicating arteries are visualized.

Anatomic variants: None significant
IMPRESSION: 1. Redemonstrated hemorrhage centered in the left basal ganglia,
likely acute and subacute blood products.
2. No large vessel occlusion or hemodynamically significant
stenosis.
3. 2 mm anteromedially directed aneurysm extending from the distal
right ICA.

## 2023-09-01 DIAGNOSIS — D631 Anemia in chronic kidney disease: Secondary | ICD-10-CM | POA: Diagnosis not present

## 2023-09-01 DIAGNOSIS — R809 Proteinuria, unspecified: Secondary | ICD-10-CM | POA: Diagnosis not present

## 2023-09-01 DIAGNOSIS — M8580 Other specified disorders of bone density and structure, unspecified site: Secondary | ICD-10-CM | POA: Diagnosis not present

## 2023-09-01 DIAGNOSIS — E1122 Type 2 diabetes mellitus with diabetic chronic kidney disease: Secondary | ICD-10-CM | POA: Diagnosis not present

## 2023-09-01 DIAGNOSIS — N184 Chronic kidney disease, stage 4 (severe): Secondary | ICD-10-CM | POA: Diagnosis not present

## 2023-09-01 DIAGNOSIS — I129 Hypertensive chronic kidney disease with stage 1 through stage 4 chronic kidney disease, or unspecified chronic kidney disease: Secondary | ICD-10-CM | POA: Diagnosis not present

## 2023-09-01 DIAGNOSIS — E875 Hyperkalemia: Secondary | ICD-10-CM | POA: Diagnosis not present

## 2023-09-30 ENCOUNTER — Encounter: Payer: Self-pay | Admitting: Adult Health

## 2023-09-30 ENCOUNTER — Ambulatory Visit: Payer: Medicare HMO | Admitting: Adult Health

## 2023-09-30 VITALS — BP 164/61 | HR 95 | Ht 59.0 in | Wt 134.0 lb

## 2023-09-30 DIAGNOSIS — I610 Nontraumatic intracerebral hemorrhage in hemisphere, subcortical: Secondary | ICD-10-CM

## 2023-09-30 DIAGNOSIS — I671 Cerebral aneurysm, nonruptured: Secondary | ICD-10-CM | POA: Diagnosis not present

## 2023-09-30 DIAGNOSIS — G3184 Mild cognitive impairment, so stated: Secondary | ICD-10-CM

## 2023-09-30 NOTE — Progress Notes (Signed)
[facility] [address] [place] [place] [address] [phone]       STROKE FOLLOW UP NOTE  Dominique Kirk Date of Birth:  December 17, 1945 Medical Record Number:  [id number]   Reason for Referral: stroke follow up    SUBJECTIVE:   CHIEF COMPLAINT:  Chief Complaint  Patient presents with   Follow-up    Patient in room #3 with daughter. Patient states she has issues on getting the words out she wants to say.    HPI:   Update 09/30/2023 JM: returns for yearly follow up visit accompanied by her daughter. Overall stale from stroke standpoint without new stroke/TIA symptoms.  Reports cognition has been stable, has started Vit B complex, continues to have some word finding difficulty occasionally but unchangd. Maintains ADLs independently, daughter assists with IADLs. Compliant on Crestor, denies side effects.  Blood pressure typically elevated at appointments, stable at home, typically 130-140s/60-80s. Routinely follows with PCP and nephrologist. Tobacco use about 1 cigarette per day.  No questions or concerns at this time    History provided for reference purposes only Update 09/29/2022 JM: Patient returns for 70-month stroke follow-up accompanied by her daughter.  Overall stable, denies new stroke/TIA symptoms. Gait stable, no recent falls. Cognition has been stable since prior visit. Daughter believes short term memory slightly improved. Remains relatively sedentary with limited exercise/activity, does not routinely do memory exercises.  Was changes from Crestor to atorvastatin by nephrology , tolerating well Blood pressure occasionally monitored at home which has been stable, elevated today which is normal for her when she comes to appointments Continued tobacco use, about 1-2/day, sometimes less  Routinely follows with PCP  Update 03/26/2022 JM: Patient returns for 64-month stroke follow-up accompanied by her daughter, Alcario Drought, and Sister Bonita Quin..  Overall stable  from stroke standpoint.  Denies new stroke/TIA symptoms.  She has since completed therapies, her gait and ambulation have improved.  Denies any recent falls.  Daughter does report occasional short term memory difficulties or delayed word finding occasionally.  She is relatively sedentary, watches TV majority of the day.  Only leaves home for doctors appointments.  Continues to live with daughter, maintains ADLs independently.  Daughter assists with majority of IADLs.  Compliant on Crestor, denies side effects.  Blood pressure today elevated, monitors at home and typically around 120s/80s. Continued tobacco use, approx 1-2 cig/day. Currently awaiting to be scheduled for repeat MRA for surveillance monitoring of cerebral aneurysm. Routinely follows with nephrology for CKD.  No further concerns at this time.  Initial visit 09/23/2021 JM: Patient being seen for initial hospital follow-up accompanied by her daughter.  Stable since discharge -denies new stroke/TIA symptoms Continues to work with [facility] therapies - working on generalized strengthening  Has been improving. No assistive device. No recent falls. Lives with daughter - doing ADLs independently.   Compliant on Crestor -denies side effects.  He Blood pressure today 132/60 - typically stable at home Since stopping meformin, has been feeling better - no more grogginess or fatigue She has remained on insulin for diabetic management monitored by PCP She does admit to occasional tobacco use but family working on gradually decreasing amount until complete cessation  No further concerns at this time  Stroke admission 08/18/2021 [name] is a 77 yo female patient with a history of HTN, HLD and diabetes who presented on 08/18/2021 after a fall at home on 10/29.  After the fall, she developed some generalized fatigue and noticed that she was moving more slowly. On  the day of admission, she reached out to her PCP who advised her to proceed to ED for  evaluation.  Personally reviewed hospitalization pertinent progress notes, lab work and imaging.  Evaluated by Dr. Roda Shutters for left anterior BG ICH likely due to small vessel disease source with hypertension.  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  Carotid Dopplers unremarkable.  EF 65 to 70%.  LDL 58.  A1c 5.4.  Found to be hypertensive on admission on Cleviprex for BP control and discharged on amlodipine and hydralazine.  No antithrombotic recommended due to ICH.  No prior stroke history.  PT/OT recommended [facility] PT/OT and discharged home.     PERTINENT IMAGING  CT head left anterior BG ICH MRI acute or subacute hemorrhage in left basal ganglia MRA  no LVO or hemodynamically significant stenosis, 2mm aneurysm on distal right ICA Carotid Doppler unremarkable 2D Echo EF 65 to 70% LDL 58 HgbA1c 5.4    ROS:   14 system review of systems performed and negative with exception of those listed in HPI  PMH:  Past Medical History:  Diagnosis Date   Diabetes mellitus without complication (HCC)    Hyperlipidemia    Hypertension     PSH: History reviewed. No pertinent surgical history.  Social History:  Social History   Socioeconomic History   Marital status: Divorced    Spouse name: Not on file   Number of children: Not on file   Years of education: Not on file   Highest education level: Not on file  Occupational History   Not on file  Tobacco Use   Smoking status: Some Days    Types: Cigarettes   Smokeless tobacco: Never  Substance and Sexual Activity   Alcohol use: Never   Drug use: Never   Sexual activity: Not on file  Other Topics Concern   Not on file  Social History Narrative   Not on file   Social Drivers of Health   Financial Resource Strain: Not on file  Food Insecurity: Not on file  Transportation Needs: Not on file  Physical Activity: Not on file  Stress: Not on file  Social Connections: Not on file  Intimate  Partner Violence: Not on file    Family History: History reviewed. No pertinent family history.  Medications:   Current Outpatient Medications on File Prior to Visit  Medication Sig Dispense Refill   amLODipine (NORVASC) 10 MG tablet Take 1 tablet (10 mg total) by mouth daily. 30 tablet 0   atorvastatin (LIPITOR) 20 MG tablet Take 20 mg by mouth daily.     FARXIGA 5 MG TABS tablet Take 10 mg by mouth daily.     ferrous sulfate 325 (65 FE) MG tablet Take 325 mg by mouth daily with breakfast.     hydrALAZINE (APRESOLINE) 50 MG tablet Take 1 tablet (50 mg total) by mouth every 8 (eight) hours. 120 tablet 1   sodium bicarbonate 650 MG tablet Take 650 mg by mouth 2 (two) times daily.     rosuvastatin (CRESTOR) 20 MG tablet Take 20 mg by mouth at bedtime. (Patient not taking: Reported on 09/29/2022)     No current facility-administered medications on file prior to visit.    Allergies:  No Known Allergies    OBJECTIVE:  Physical Exam  Vitals:   09/30/23 1343  BP: (!) 164/61  Pulse: 95  Weight: 134 lb (60.8 kg)  Height: 4\' 11"  (1.499 m)   Body  mass index is 27.06 kg/m. No results found.  General: well developed, well nourished, very pleasant elderly African-American female, seated, in no evident distress  Neurologic Exam Mental Status: Awake and fully alert.  Fluent speech and language.  Oriented to place and month, disoriented to year. Recent memory impaired and remote memory intact. Attention span, concentration and fund of knowledge mildly impaired. Mood and affect appropriate.  Cranial Nerves: Pupils equal, briskly reactive to light. Extraocular movements full without nystagmus. Visual fields full to confrontation. Hearing intact. Facial sensation intact. Face, tongue, palate moves normally and symmetrically.  Motor: Normal bulk and tone. Normal strength in all tested extremity muscles except mild bilateral hip flexor weakness (similar compared to testing 1 yr ago) Sensory.:  intact to touch , pinprick , position and vibratory sensation.  Coordination: Rapid alternating movements normal in all extremities. Finger-to-nose and heel-to-shin performed accurately bilaterally. Gait and Station: Arises from chair without difficulty. Stance is normal. Gait demonstrates normal stride length and mild imbalance/unsteadiness without use of AD.   Reflexes: 1+ and symmetric. Toes downgoing.        ASSESSMENT: Dominique Kirk is a 77 y.o. 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 Vascular risk factors include HTN, HLD, DM and tobacco use.      PLAN:  L BG ICH :  Very slight continued mild bilateral hip flexor weakness.  Encourage increasing daily activity and strengthening exercises at home.  Discussed fall precautions. Continue Crestor for secondary stroke prevention managed/prescribed by PCP/nephrology No indication for antithrombotic in setting of recent ICH with no prior stroke history or hx of vascular stents Repeat [facility] 09/2021 showed resolved left BG hemorrhage Discussed secondary stroke prevention measures and importance of close PCP follow up for aggressive stroke risk factor management including BP goal<130/90, HLD with LDL goal<70 and DM with A1c.<7.  Discussed importance of complete tobacco cessation for which she verbalizes understanding I have gone over the pathophysiology of stroke, warning signs and symptoms, risk factors and their management in some detail with instructions to go to the closest emergency room for symptoms of concern.  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  Overall stable, some intermittent word finding difficulty.  Discussed importance of cognitive exercises routinely as well as ensuring a healthy diet, adequate sleep and routine exercise.  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  unable to obtain CTA due to impaired kidney function    Can follow up as needed at this time. Based on repeat MRA, may need to have follow up visit if continued imaging is required otherwise can follow up as needed. Advised to call with any questions or concerns in the future.   CC:  PCP: Buckner Malta, MD    I spent 25 minutes of face-to-face and non-face-to-face time with patient and daughter.  This included previsit chart review, lab review, study review, electronic health record documentation, patient and and daughter education and discussion regarding above diagnoses and treatment plan and answered all questions to patient and daughter satisfaction  Ihor Austin, AGNP-BC  [facility] [phone]  [address] [place], [place] [address]  Phone [phone] Fax [phone] Note: This document was prepared with digital dictation and possible smart phrase technology. Any transcriptional errors that result from this process are unintentional.

## 2023-09-30 NOTE — Patient Instructions (Addendum)
Your Plan:  No changes today -continue current medications and ensure routine follow-up with PCP for stroke risk factor management  Ensure routine cognitive exercises as well as physical exercises, healthy diet and adequate sleep as well as management of blood pressure, cholesterol and diabetes  Plan on repeat MRA head in June 2025 for surveillance monitoring of cerebral aneurysm     Follow up as needed at this time, please call with any questions or concerns in the future      Thank you for coming to see [place] at [facility]. I hope we have been able to provide you high quality care today.  You may receive a patient satisfaction survey over the next few weeks. We would appreciate your feedback and comments so that we may continue to improve ourselves and the health of our patients.

## 2023-10-28 ENCOUNTER — Telehealth: Payer: Self-pay | Admitting: Pharmacist

## 2023-10-28 NOTE — Progress Notes (Signed)
   10/28/2023  Patient ID: Dominique Kirk, female   DOB: [birth year], 78 y.o.   MRN: [id number]    10/28/2023  Dominique Kirk 1946/03/25 [id number]   2025 Medication Assistance Renewal Application Summary:  Patient was outreached regarding medication assistance renewal for 2025. Verified address, anticipated insurance for 2025, and income has not changed. Spoke with her daughter, Dominique Kirk.  Patient remains interested in PAP for 2025 for Farxiga and Tresiba. An attempt to complete the attestation online for [employer] Dominique Kirk) was made unsuccessfully.  Patient will be sent new applications for both Farxiga and Tresiba.    Patient's daughter communicated understanding about the necessary paperwork and will send a copy of the Patient's Social Security Benefits Letter as proof of income.     Additional medication assistance options reviewed with patient as warranted:  No other options identified  Plan: I will route patient assistance letter to pharmacy technician who will coordinate patient assistance program application process for medications listed above.  Pharmacy technician will assist with obtaining all required documents from both patient and provider(s) and submit application(s) once completed.    Thank you for allowing pharmacy to be a part of this patient's care.  Dominique Kirk, PharmD, BCACP [facility] Clinical Pharmacist [phone]

## 2023-11-01 ENCOUNTER — Telehealth: Payer: Self-pay | Admitting: Pharmacy Technician

## 2023-11-01 DIAGNOSIS — Z5986 Financial insecurity: Secondary | ICD-10-CM

## 2023-11-01 NOTE — Progress Notes (Addendum)
 Pharmacy Medication Assistance Program Note    11/01/2023  Patient ID: Dominique Kirk, female   DOB: October 28, 1945, 78 y.o.   MRN: [id number]     11/01/2023  Outreach Medication One  Initial Outreach Date (Medication One) 10/28/2023  Manufacturer Medication One [employer] [facility] [place]  Dose of Tresiba FlexTouch 100 units/ml  Type of Radiographer, Therapeutic Assistance  Date Application Sent to Patient 11/03/2023  Application Items Requested Application;Proof of Income;Other  Date Application Sent to Prescriber 11/03/2023  Name of Prescriber Delon Contes        11/01/2023  Outreach Medication Two  Initial Outreach Date (Medication Two) 10/28/2023  Manufacturer Medication Two [employer]  [employer] [facility] Farxiga  Dose of Farxiga 10mg   Type of Radiographer, Therapeutic Assistance  Date Application Sent to Patient 11/03/2023  Application Items Requested Application;Proof of Income;Other  Date Application Sent to Prescriber 11/03/2023  Name of Prescriber Delon Contes    [facility] 11/26/2023 Successful outreach to patient. Spoke to patient's daughter Dominique Kirk. HIPAA verified. She informs the applications as outlined above were mailed back this week. Informed Dominique Kirk once received then would submit to the patient assistance companies and would call her back. Dominique Kirk verbalized understanding.   Kate Caddy, CPhT [place]  Office: [phone] Fax: [phone] Email: [email or web address]

## 2023-11-22 DIAGNOSIS — N184 Chronic kidney disease, stage 4 (severe): Secondary | ICD-10-CM | POA: Diagnosis not present

## 2023-12-02 DIAGNOSIS — N184 Chronic kidney disease, stage 4 (severe): Secondary | ICD-10-CM | POA: Diagnosis not present

## 2023-12-02 DIAGNOSIS — D631 Anemia in chronic kidney disease: Secondary | ICD-10-CM | POA: Diagnosis not present

## 2023-12-02 DIAGNOSIS — R809 Proteinuria, unspecified: Secondary | ICD-10-CM | POA: Diagnosis not present

## 2023-12-02 DIAGNOSIS — E1129 Type 2 diabetes mellitus with other diabetic kidney complication: Secondary | ICD-10-CM | POA: Diagnosis not present

## 2023-12-02 DIAGNOSIS — E1122 Type 2 diabetes mellitus with diabetic chronic kidney disease: Secondary | ICD-10-CM | POA: Diagnosis not present

## 2023-12-02 DIAGNOSIS — I129 Hypertensive chronic kidney disease with stage 1 through stage 4 chronic kidney disease, or unspecified chronic kidney disease: Secondary | ICD-10-CM | POA: Diagnosis not present

## 2023-12-02 DIAGNOSIS — M8580 Other specified disorders of bone density and structure, unspecified site: Secondary | ICD-10-CM | POA: Diagnosis not present

## 2023-12-02 DIAGNOSIS — E875 Hyperkalemia: Secondary | ICD-10-CM | POA: Diagnosis not present

## 2023-12-02 DIAGNOSIS — N189 Chronic kidney disease, unspecified: Secondary | ICD-10-CM | POA: Diagnosis not present

## 2023-12-08 ENCOUNTER — Telehealth: Payer: Self-pay | Admitting: Pharmacy Technician

## 2023-12-08 DIAGNOSIS — Z5986 Financial insecurity: Secondary | ICD-10-CM

## 2023-12-08 NOTE — Progress Notes (Signed)
Pharmacy Medication Assistance Program Note    12/08/2023  Patient ID: Dominique Kirk, female   DOB: August 03, 1946, 78 y.o.   MRN: [id number]     11/01/2023 12/08/2023  Outreach Medication One  Initial Outreach Date (Medication One) 10/28/2023   Manufacturer Medication One [employer] [facility] Evaristo Bury   Dose of Evaristo Bury FlexTouch 100 units/ml   Type of Radiographer, therapeutic Assistance   Date Application Sent to Patient 11/03/2023   Application Items Requested Application;Proof of Income;Other   Date Application Sent to Prescriber 11/03/2023   Name of Prescriber Buckner Malta   Date Application Received From Patient  12/02/2023  Application Items Received From Patient  Application;Proof of Income;Other  Date Application Received From Provider  12/03/2023  Date Application Submitted to Manufacturer  12/08/2023  Method Application Sent to Manufacturer  Fax       11/01/2023 12/08/2023  Outreach Medication Two  Initial Outreach Date (Medication Two) 10/28/2023   Manufacturer Medication Two [employer]   [employer] [facility] Farxiga   Dose of Farxiga 10mg    Type of Radiographer, therapeutic Assistance   Date Application Sent to Patient 11/03/2023   Application Items Requested Application;Proof of Income;Other   Date Application Sent to Prescriber 11/03/2023   Name of Prescriber Buckner Malta   Date Application Received From Patient  12/02/2023  Application Items Received From Patient  Application;Proof of Income;Other  Date Application Received From Provider  11/05/2023  Method Application Sent to Manufacturer  Fax  Date Application Submitted to Manufacturer  12/08/2023   Submitted apps.  Pattricia Boss, CPhT [place]  Office: [phone] Fax: [phone] Email: [email or web address]

## 2023-12-20 ENCOUNTER — Telehealth: Payer: Self-pay | Admitting: Pharmacy Technician

## 2023-12-20 DIAGNOSIS — N184 Chronic kidney disease, stage 4 (severe): Secondary | ICD-10-CM | POA: Diagnosis not present

## 2023-12-20 DIAGNOSIS — H02841 Edema of right upper eyelid: Secondary | ICD-10-CM | POA: Diagnosis not present

## 2023-12-20 DIAGNOSIS — Z780 Asymptomatic menopausal state: Secondary | ICD-10-CM | POA: Diagnosis not present

## 2023-12-20 DIAGNOSIS — F0150 Vascular dementia without behavioral disturbance: Secondary | ICD-10-CM | POA: Diagnosis not present

## 2023-12-20 DIAGNOSIS — Z0000 Encounter for general adult medical examination without abnormal findings: Secondary | ICD-10-CM | POA: Diagnosis not present

## 2023-12-20 DIAGNOSIS — E785 Hyperlipidemia, unspecified: Secondary | ICD-10-CM | POA: Diagnosis not present

## 2023-12-20 DIAGNOSIS — Z5986 Financial insecurity: Secondary | ICD-10-CM

## 2023-12-20 DIAGNOSIS — E1122 Type 2 diabetes mellitus with diabetic chronic kidney disease: Secondary | ICD-10-CM | POA: Diagnosis not present

## 2023-12-20 DIAGNOSIS — Z79899 Other long term (current) drug therapy: Secondary | ICD-10-CM | POA: Diagnosis not present

## 2023-12-20 DIAGNOSIS — E1159 Type 2 diabetes mellitus with other circulatory complications: Secondary | ICD-10-CM | POA: Diagnosis not present

## 2023-12-20 NOTE — Progress Notes (Addendum)
 Pharmacy Medication Assistance Program Note    12/20/2023  Patient ID: Dominique Kirk, female   DOB: 1946/04/10, 78 y.o.   MRN: [id number]     11/01/2023 12/08/2023 12/20/2023  Outreach Medication One  Initial Outreach Date (Medication One) 10/28/2023    Manufacturer Medication One [employer] [facility] Evaristo Bury    Dose of Evaristo Bury FlexTouch 100 units/ml    Type of Radiographer, therapeutic Assistance    Date Application Sent to Patient 11/03/2023    Application Items Requested Application;Proof of Income;Other    Date Application Sent to Prescriber 11/03/2023    Name of Prescriber Buckner Malta    Date Application Received From Patient  12/02/2023   Application Items Received From Patient  Application;Proof of Income;Other   Date Application Received From Provider  12/03/2023   Date Application Submitted to Manufacturer  12/08/2023   Method Application Sent to Manufacturer  Fax   Patient Assistance Determination   Approved  Approval Start Date   12/09/2023  Approval End Date   10/18/2024  Patient Notification Method   Telephone Call  Telephone Call Outcome   Left Voicemail       11/01/2023 12/08/2023 12/20/2023  Outreach Medication Two  Initial Outreach Date (Medication Two) 10/28/2023    Manufacturer Medication Two [employer]    [employer] [facility] Farxiga    Dose of Farxiga 10mg     Type of Radiographer, therapeutic Assistance    Date Application Sent to Patient 11/03/2023    Application Items Requested Application;Proof of Income;Other    Date Application Sent to Prescriber 11/03/2023    Name of Prescriber Buckner Malta    Date Application Received From Patient  12/02/2023   Application Items Received From Patient  Application;Proof of Income;Other   Date Application Received From Provider  11/05/2023   Method Application Sent to Manufacturer  Fax   Date Application Submitted to Manufacturer  12/08/2023   Patient Assistance Determination   Approved  Approval Start Date   12/09/2023   Patient Notification Method   Telephone Call  Telephone Call Outcome   Left Voicemail   2 care coordination calls placed to [employer] in regard to [place] and [employer] in regard to [place].  1st call placed to [employer]. Per IVR system and per letter scanned into [facility] EMR, patient is APPROVED 12/09/23-10/18/2024. Initial and subsequent refills and shipments will process automatically and be delivered to the prescriber's office. Patient may call [employer] at any time to check on shipments by calling [phone].  2nd call placed to [employer]. Per IVR system, patient is APPROVED 12/09/23-10/18/24. Initial and subsequent refills will process automatically and be delivered to the patient's home. Patient may call [employer] at any time to check on shipments by calling [phone].   Unsuccessful outreach to patient. HIPAA compliant voicemail left requesting a return call. Was calling to inform patient of the above information.  In basket message will be sent to [facility] Pharmacist Tiffany Benfield notifying her of this information.  Pattricia Boss, CPhT [place]  Office: [phone] Fax: [phone] Email: [email or web address]

## 2024-01-04 DIAGNOSIS — H0231 Blepharochalasis right upper eyelid: Secondary | ICD-10-CM | POA: Diagnosis not present

## 2024-01-16 IMAGING — US US RENAL
1 series · 14 of 25 positions shown · non-contrast
Comparison: None.

CLINICAL DATA: Stage IV chronic renal insufficiency

EXAM:
RENAL / URINARY TRACT ULTRASOUND COMPLETE

[Series 1: us renal · 0.20mm/px · 14 of 41 slices shown]
[im 1/41]
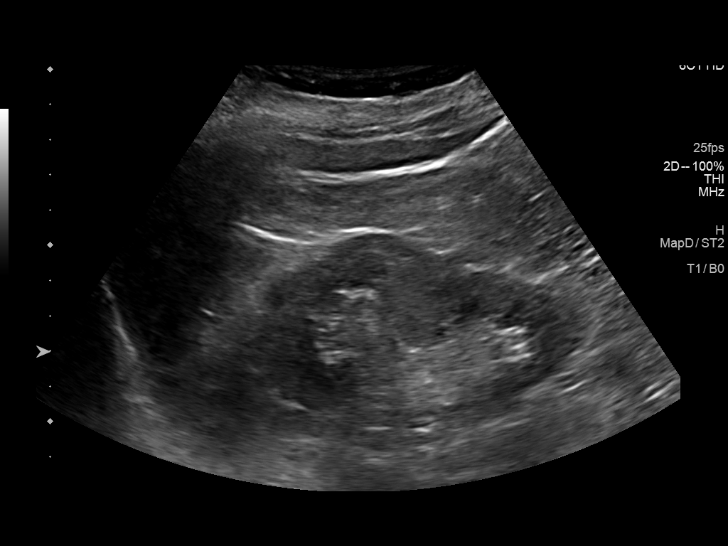
[im 4/41]
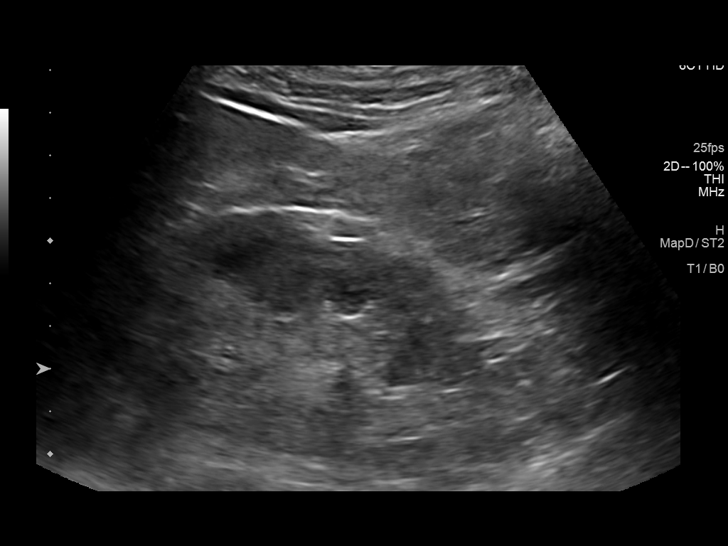
[im 7/41]
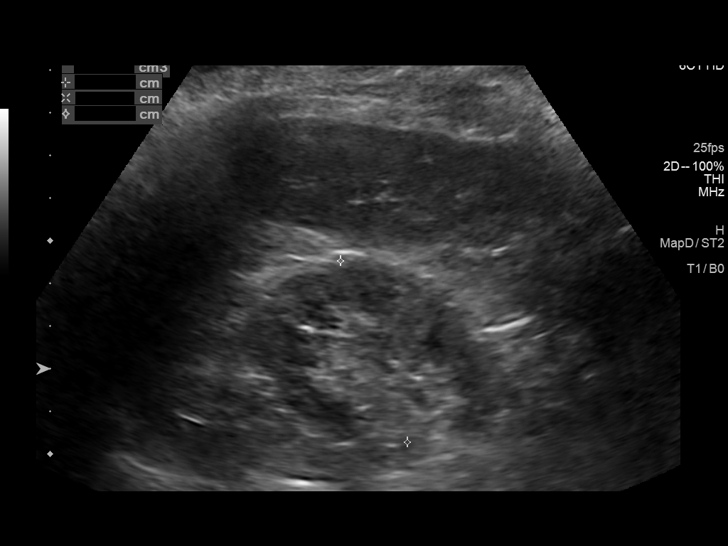
[im 11/41]
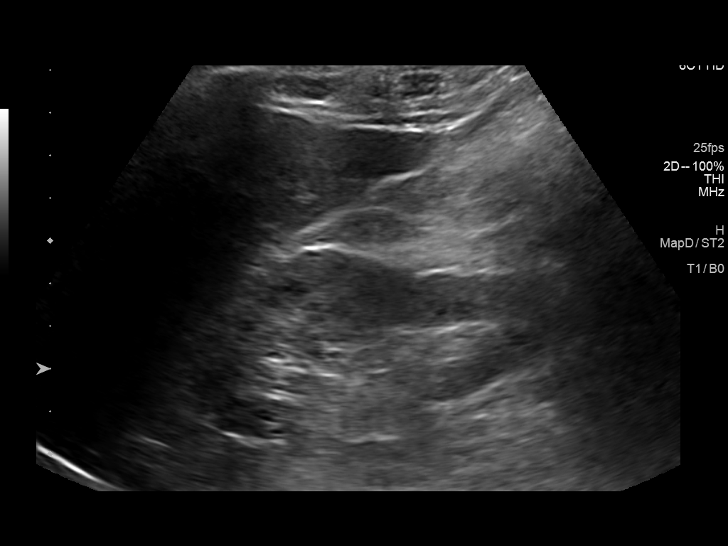
[im 14/41]
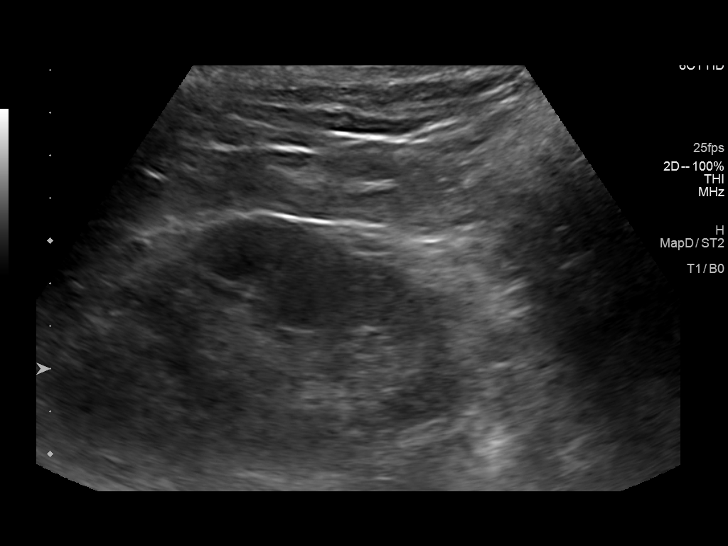
[im 16/41]
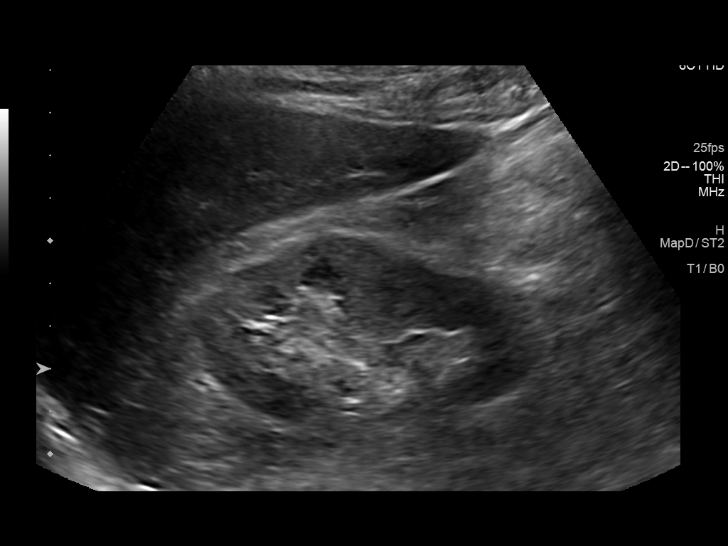
[im 19/41]
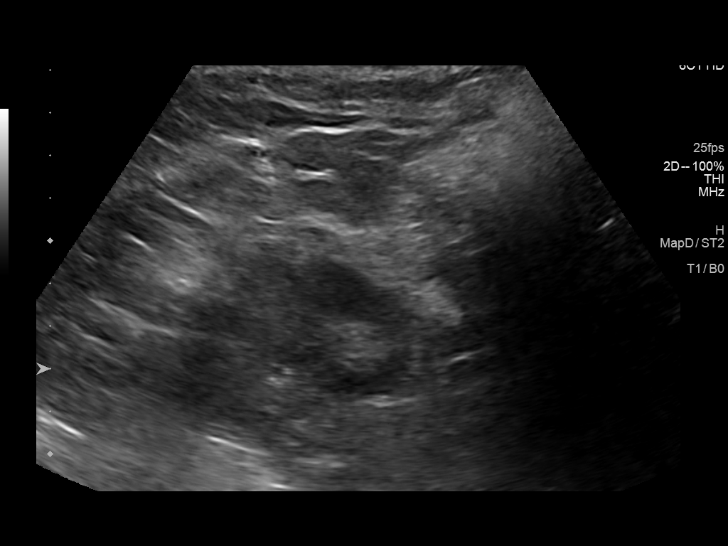
[im 22/41]
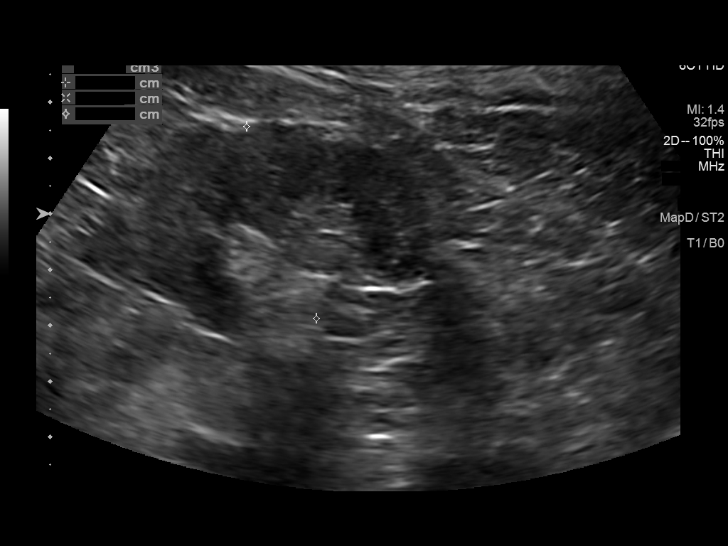
[im 26/41]
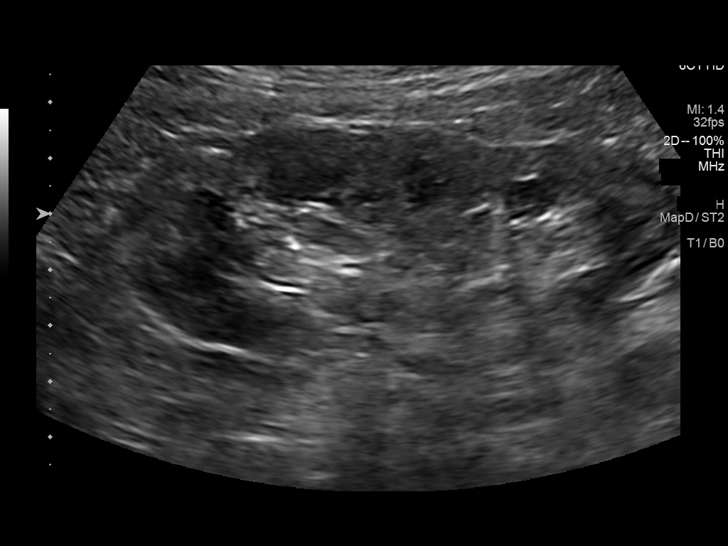
[im 27/41]
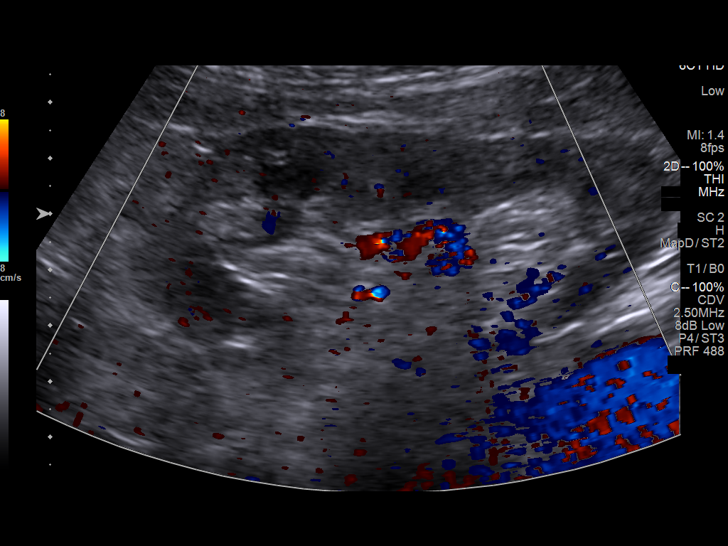
[im 31/41]
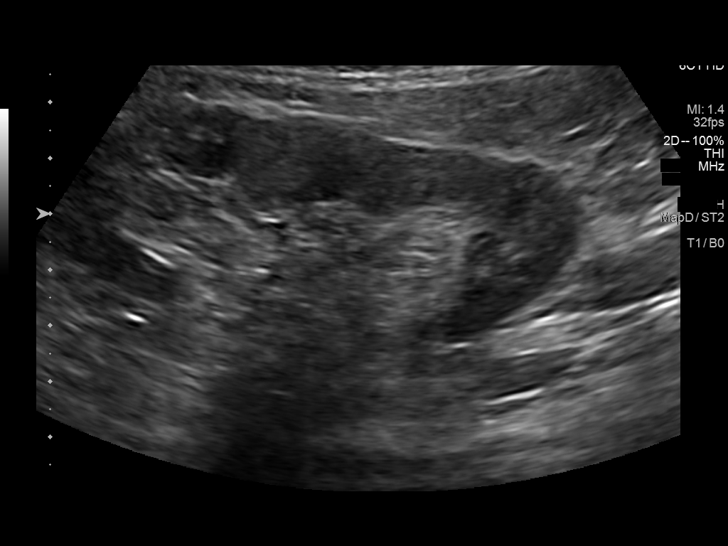
[im 34/41]
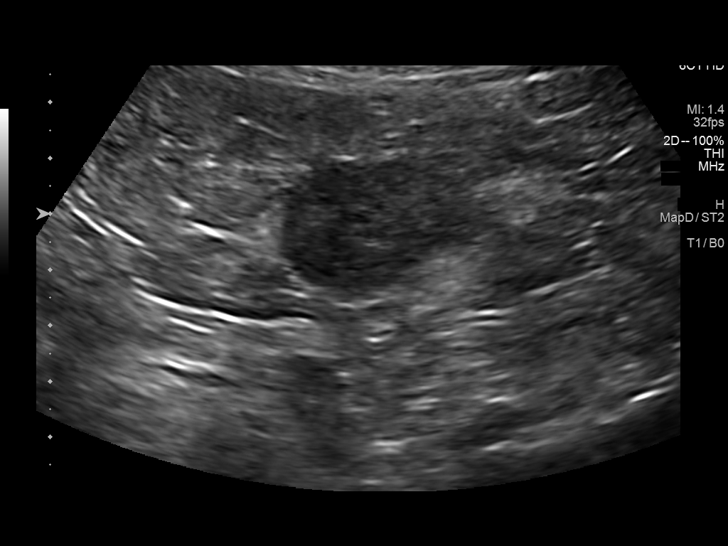
[im 37/41]
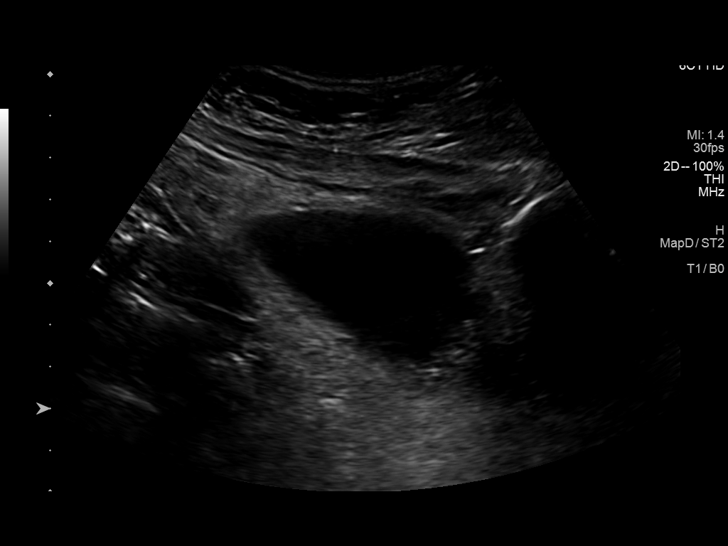
[im 41/41]
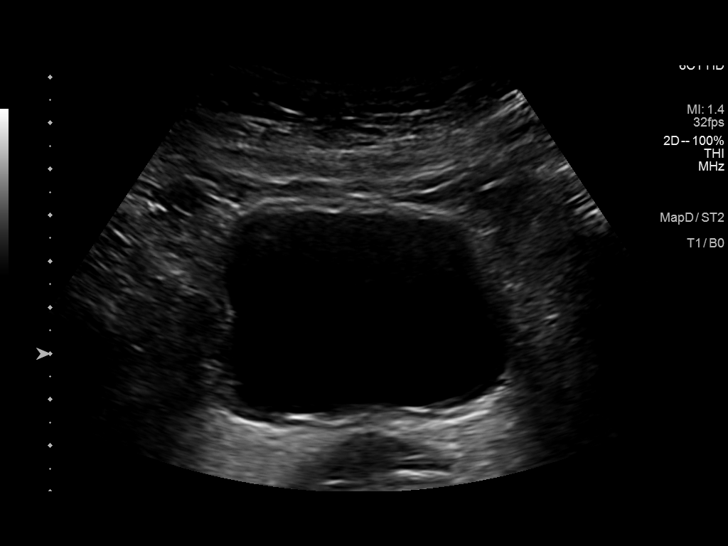

[14 of 25 positions shown; findings below may reference images not displayed]

FINDINGS: Right Kidney:

Renal measurements: 9.4 x 4.9 x 4.5 cm = volume: 109.2 mL. Increased
renal cortical echotexture consistent with medical renal disease. No
hydronephrosis or renal mass.

Left Kidney:

Renal measurements: 8.3 x 3.3 x 3.7 cm = volume: 52.9 mL. Increased
renal cortical echotexture consistent with medical renal disease. No
hydronephrosis or renal mass.

Bladder:

Appears normal for degree of bladder distention.

Other:

None.
IMPRESSION: 1. Increased renal cortical echotexture consistent with medical
renal disease. Otherwise unremarkable exam.

## 2024-01-17 ENCOUNTER — Telehealth: Payer: Self-pay | Admitting: Pharmacist

## 2024-01-17 NOTE — Progress Notes (Signed)
   01/17/2024  Patient ID: Dominique Kirk, female   DOB: 12-15-1945, 78 y.o.   MRN: [id number]  Telephonic outreach placed to patient, spoke with daughter, Dominique Kirk. Notified Evaristo Bury and pen needles arrived at practice. May pick up at their convenience.  Reynold Bowen, PharmD Clinical Pharmacist [facility] Direct Dial: [phone]

## 2024-02-21 DIAGNOSIS — N184 Chronic kidney disease, stage 4 (severe): Secondary | ICD-10-CM | POA: Diagnosis not present

## 2024-02-29 DIAGNOSIS — D631 Anemia in chronic kidney disease: Secondary | ICD-10-CM | POA: Diagnosis not present

## 2024-02-29 DIAGNOSIS — R809 Proteinuria, unspecified: Secondary | ICD-10-CM | POA: Diagnosis not present

## 2024-02-29 DIAGNOSIS — N184 Chronic kidney disease, stage 4 (severe): Secondary | ICD-10-CM | POA: Diagnosis not present

## 2024-02-29 DIAGNOSIS — E875 Hyperkalemia: Secondary | ICD-10-CM | POA: Diagnosis not present

## 2024-02-29 DIAGNOSIS — E1122 Type 2 diabetes mellitus with diabetic chronic kidney disease: Secondary | ICD-10-CM | POA: Diagnosis not present

## 2024-02-29 DIAGNOSIS — E872 Acidosis, unspecified: Secondary | ICD-10-CM | POA: Diagnosis not present

## 2024-02-29 DIAGNOSIS — N2581 Secondary hyperparathyroidism of renal origin: Secondary | ICD-10-CM | POA: Diagnosis not present

## 2024-02-29 DIAGNOSIS — I129 Hypertensive chronic kidney disease with stage 1 through stage 4 chronic kidney disease, or unspecified chronic kidney disease: Secondary | ICD-10-CM | POA: Diagnosis not present

## 2024-03-06 ENCOUNTER — Other Ambulatory Visit: Payer: Self-pay | Admitting: Adult Health

## 2024-03-06 DIAGNOSIS — I671 Cerebral aneurysm, nonruptured: Secondary | ICD-10-CM

## 2024-03-09 DIAGNOSIS — E119 Type 2 diabetes mellitus without complications: Secondary | ICD-10-CM | POA: Diagnosis not present

## 2024-03-09 DIAGNOSIS — Z794 Long term (current) use of insulin: Secondary | ICD-10-CM | POA: Diagnosis not present

## 2024-03-09 DIAGNOSIS — H25813 Combined forms of age-related cataract, bilateral: Secondary | ICD-10-CM | POA: Diagnosis not present

## 2024-03-09 DIAGNOSIS — H5203 Hypermetropia, bilateral: Secondary | ICD-10-CM | POA: Diagnosis not present

## 2024-03-09 DIAGNOSIS — H52223 Regular astigmatism, bilateral: Secondary | ICD-10-CM | POA: Diagnosis not present

## 2024-03-09 DIAGNOSIS — Z7984 Long term (current) use of oral hypoglycemic drugs: Secondary | ICD-10-CM | POA: Diagnosis not present

## 2024-03-15 ENCOUNTER — Telehealth: Payer: Self-pay | Admitting: Adult Health

## 2024-03-15 NOTE — Telephone Encounter (Signed)
 [place] Rivers: [id number] exp. 03/15/24-05/14/24 sent to [facility] [phone]

## 2024-03-30 ENCOUNTER — Ambulatory Visit
Admission: RE | Admit: 2024-03-30 | Discharge: 2024-03-30 | Disposition: A | Discharge status: Home / Self Care | Source: Ambulatory Visit | Attending: Adult Health | Admitting: Adult Health

## 2024-03-30 DIAGNOSIS — I671 Cerebral aneurysm, nonruptured: Secondary | ICD-10-CM | POA: Diagnosis not present

## 2024-04-03 ENCOUNTER — Ambulatory Visit: Payer: Self-pay | Admitting: Adult Health

## 2024-04-03 DIAGNOSIS — R300 Dysuria: Secondary | ICD-10-CM | POA: Diagnosis not present

## 2024-04-03 DIAGNOSIS — N184 Chronic kidney disease, stage 4 (severe): Secondary | ICD-10-CM | POA: Diagnosis not present

## 2024-04-03 DIAGNOSIS — E1122 Type 2 diabetes mellitus with diabetic chronic kidney disease: Secondary | ICD-10-CM | POA: Diagnosis not present

## 2024-04-03 DIAGNOSIS — Z6826 Body mass index (BMI) 26.0-26.9, adult: Secondary | ICD-10-CM | POA: Diagnosis not present

## 2024-04-03 DIAGNOSIS — E785 Hyperlipidemia, unspecified: Secondary | ICD-10-CM | POA: Diagnosis not present

## 2024-04-03 DIAGNOSIS — E1169 Type 2 diabetes mellitus with other specified complication: Secondary | ICD-10-CM | POA: Diagnosis not present

## 2024-04-03 DIAGNOSIS — F0150 Vascular dementia without behavioral disturbance: Secondary | ICD-10-CM | POA: Diagnosis not present

## 2024-04-03 DIAGNOSIS — R8290 Unspecified abnormal findings in urine: Secondary | ICD-10-CM | POA: Diagnosis not present

## 2024-05-03 DIAGNOSIS — H02834 Dermatochalasis of left upper eyelid: Secondary | ICD-10-CM | POA: Diagnosis not present

## 2024-05-03 DIAGNOSIS — H02831 Dermatochalasis of right upper eyelid: Secondary | ICD-10-CM | POA: Diagnosis not present

## 2024-05-22 DIAGNOSIS — N184 Chronic kidney disease, stage 4 (severe): Secondary | ICD-10-CM | POA: Diagnosis not present

## 2024-05-24 DIAGNOSIS — H25813 Combined forms of age-related cataract, bilateral: Secondary | ICD-10-CM | POA: Diagnosis not present

## 2024-06-01 DIAGNOSIS — R809 Proteinuria, unspecified: Secondary | ICD-10-CM | POA: Diagnosis not present

## 2024-06-01 DIAGNOSIS — I129 Hypertensive chronic kidney disease with stage 1 through stage 4 chronic kidney disease, or unspecified chronic kidney disease: Secondary | ICD-10-CM | POA: Diagnosis not present

## 2024-06-01 DIAGNOSIS — E1122 Type 2 diabetes mellitus with diabetic chronic kidney disease: Secondary | ICD-10-CM | POA: Diagnosis not present

## 2024-06-01 DIAGNOSIS — N2581 Secondary hyperparathyroidism of renal origin: Secondary | ICD-10-CM | POA: Diagnosis not present

## 2024-06-01 DIAGNOSIS — E875 Hyperkalemia: Secondary | ICD-10-CM | POA: Diagnosis not present

## 2024-06-01 DIAGNOSIS — D631 Anemia in chronic kidney disease: Secondary | ICD-10-CM | POA: Diagnosis not present

## 2024-06-01 DIAGNOSIS — E872 Acidosis, unspecified: Secondary | ICD-10-CM | POA: Diagnosis not present

## 2024-06-01 DIAGNOSIS — N184 Chronic kidney disease, stage 4 (severe): Secondary | ICD-10-CM | POA: Diagnosis not present

## 2024-06-09 DIAGNOSIS — H02831 Dermatochalasis of right upper eyelid: Secondary | ICD-10-CM | POA: Diagnosis not present

## 2024-06-09 DIAGNOSIS — H02834 Dermatochalasis of left upper eyelid: Secondary | ICD-10-CM | POA: Diagnosis not present

## 2024-06-09 DIAGNOSIS — H53453 Other localized visual field defect, bilateral: Secondary | ICD-10-CM | POA: Diagnosis not present

## 2024-07-04 DIAGNOSIS — B351 Tinea unguium: Secondary | ICD-10-CM | POA: Diagnosis not present

## 2024-07-04 DIAGNOSIS — H269 Unspecified cataract: Secondary | ICD-10-CM | POA: Diagnosis not present

## 2024-07-04 DIAGNOSIS — Z23 Encounter for immunization: Secondary | ICD-10-CM | POA: Diagnosis not present

## 2024-07-04 DIAGNOSIS — N184 Chronic kidney disease, stage 4 (severe): Secondary | ICD-10-CM | POA: Diagnosis not present

## 2024-07-04 DIAGNOSIS — E1122 Type 2 diabetes mellitus with diabetic chronic kidney disease: Secondary | ICD-10-CM | POA: Diagnosis not present

## 2024-07-04 DIAGNOSIS — I69322 Dysarthria following cerebral infarction: Secondary | ICD-10-CM | POA: Diagnosis not present

## 2024-07-04 DIAGNOSIS — F0150 Vascular dementia without behavioral disturbance: Secondary | ICD-10-CM | POA: Diagnosis not present

## 2024-07-04 DIAGNOSIS — Z6824 Body mass index (BMI) 24.0-24.9, adult: Secondary | ICD-10-CM | POA: Diagnosis not present

## 2024-07-18 ENCOUNTER — Telehealth: Payer: Self-pay

## 2024-07-18 DIAGNOSIS — H47233 Glaucomatous optic atrophy, bilateral: Secondary | ICD-10-CM | POA: Diagnosis not present

## 2024-07-18 DIAGNOSIS — H40053 Ocular hypertension, bilateral: Secondary | ICD-10-CM | POA: Diagnosis not present

## 2024-07-18 NOTE — Telephone Encounter (Signed)
 Completed reorder form for Pen Needles through [employer] and faxed to Dr. Delon Contes at [facility] for review

## 2024-07-21 NOTE — Telephone Encounter (Signed)
 Faxed completed and signed reorder from to [employer]

## 2024-07-25 DIAGNOSIS — H47233 Glaucomatous optic atrophy, bilateral: Secondary | ICD-10-CM | POA: Diagnosis not present

## 2024-07-25 DIAGNOSIS — H40053 Ocular hypertension, bilateral: Secondary | ICD-10-CM | POA: Diagnosis not present

## 2024-07-26 ENCOUNTER — Telehealth: Payer: Self-pay

## 2024-07-26 NOTE — Telephone Encounter (Signed)
 PAP: Patient assistance application for Farxiga through [employer] ([employer]) has been mailed to pt's home address on file. Provider portion of application will be faxed to provider's office.  PAP: Patient assistance application for Tresiba through [employer] has been mailed to [employer] home address on file. Provider portion of application will be faxed to provider's office.  Provider portion of application will be faxed to Dr. Delon Contes

## 2024-07-27 ENCOUNTER — Ambulatory Visit: Admitting: Podiatry

## 2024-07-27 ENCOUNTER — Other Ambulatory Visit: Payer: Self-pay | Admitting: Podiatry

## 2024-07-27 ENCOUNTER — Encounter: Payer: Self-pay | Admitting: Podiatry

## 2024-07-27 DIAGNOSIS — B351 Tinea unguium: Secondary | ICD-10-CM

## 2024-07-27 DIAGNOSIS — B353 Tinea pedis: Secondary | ICD-10-CM

## 2024-07-27 DIAGNOSIS — E1140 Type 2 diabetes mellitus with diabetic neuropathy, unspecified: Secondary | ICD-10-CM | POA: Diagnosis not present

## 2024-07-27 DIAGNOSIS — M79675 Pain in left toe(s): Secondary | ICD-10-CM

## 2024-07-27 DIAGNOSIS — M79674 Pain in right toe(s): Secondary | ICD-10-CM

## 2024-07-27 MED ORDER — KETOCONAZOLE 2 % EX GEL: 1.0000 | Freq: Every day | CUTANEOUS | 1 refills | Status: DC

## 2024-07-27 NOTE — Progress Notes (Signed)
  Subjective:  Patient ID: Dominique Kirk, female    DOB: 05-23-1946,  MRN: [id number]  Chief Complaint  Patient presents with   [facility]    [facility], no callous, with exam.  A1c was 5 something in Sep, patient daughter reported.  No anti coag.     78 y.o. female presents with the above complaint. History confirmed with patient. Patient presenting with pain related to dystrophic thickened elongated nails. Patient is unable to trim own nails related to nail dystrophy and mobility issues. Patient does have a history of T2DM.  Last A1c was in the fives according to daughter.  Daughter is present and assist with history.  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  Objective:  Physical Exam: warm, good capillary refill, sparse pedal hair growth, pedal skin atrophic nail exam onychomycosis of the toenails, onycholysis, dystrophic nails greater than 3 mm thickening DP pulses faintly palpable, PT pulses faintly palpable, and vibratory sensation diminished, patient has difficulty participating in protective sensation examination Left Foot:  Pain with palpation of nails due to elongation and dystrophic growth.  Right Foot: Pain with palpation of nails due to elongation and dystrophic growth.  Interdigital maceration present with mild erythema, flaky skin. Muscle strength 5/5 for all major muscle groups.  No symptomatic pedal deformities.  Assessment:   1. Tinea pedis of both feet   2. Pain due to onychomycosis of toenails of both feet   3. Controlled type 2 diabetes mellitus with neuropathy (HCC)      Plan:  Patient was evaluated and treated and all questions answered.  # Interdigital tinea pedis of both feet -Ketoconazole 2% gel or cream sent to patient's pharmacy - Apply daily for 3 weeks. - Monitor for excessive or trapped moisture - Importance of pedal hygiene in the treatment maintenance of tinea pedis discussed with patient - Follow up in 3-4 weeks for this -I  certify that this diagnosis represents a distinct and separate diagnosis that requires evaluation and treatment separate from other procedures or diagnosis   #Onychomycosis with pain  -Nails palliatively debrided as below. -Educated on self-care  Procedure: Nail Debridement Rationale: Pain Type of Debridement: manual, sharp debridement. Instrumentation: Nail nipper, rotary burr. Number of Nails: 10  # Diabetes with neuropathy Patient educated on diabetes. Discussed proper diabetic foot care and discussed risks and complications of disease. Educated patient in depth on reasons to return to the office immediately should he/she discover anything concerning or new on the feet. All questions answered. Discussed proper shoes as well.    Return in about 4 weeks (around 08/24/2024) for Tinea pedis check.         Ethan Saddler, DPM [facility]

## 2024-07-28 DIAGNOSIS — H25813 Combined forms of age-related cataract, bilateral: Secondary | ICD-10-CM | POA: Diagnosis not present

## 2024-07-28 DIAGNOSIS — E119 Type 2 diabetes mellitus without complications: Secondary | ICD-10-CM | POA: Diagnosis not present

## 2024-08-02 DIAGNOSIS — E872 Acidosis, unspecified: Secondary | ICD-10-CM | POA: Diagnosis not present

## 2024-08-02 DIAGNOSIS — N184 Chronic kidney disease, stage 4 (severe): Secondary | ICD-10-CM | POA: Diagnosis not present

## 2024-08-02 DIAGNOSIS — E1122 Type 2 diabetes mellitus with diabetic chronic kidney disease: Secondary | ICD-10-CM | POA: Diagnosis not present

## 2024-08-02 DIAGNOSIS — N2581 Secondary hyperparathyroidism of renal origin: Secondary | ICD-10-CM | POA: Diagnosis not present

## 2024-08-02 DIAGNOSIS — R809 Proteinuria, unspecified: Secondary | ICD-10-CM | POA: Diagnosis not present

## 2024-08-02 DIAGNOSIS — D631 Anemia in chronic kidney disease: Secondary | ICD-10-CM | POA: Diagnosis not present

## 2024-08-02 DIAGNOSIS — I129 Hypertensive chronic kidney disease with stage 1 through stage 4 chronic kidney disease, or unspecified chronic kidney disease: Secondary | ICD-10-CM | POA: Diagnosis not present

## 2024-08-02 DIAGNOSIS — E875 Hyperkalemia: Secondary | ICD-10-CM | POA: Diagnosis not present

## 2024-08-03 NOTE — Telephone Encounter (Signed)
 Received provider portion of Patient assistance application

## 2024-08-10 NOTE — Telephone Encounter (Signed)
 Pt. Did not receive app in mail-will mail another app 10/123.  Also told patient she will need to apply for LIS and sent instructions on how to do that.

## 2024-08-17 DIAGNOSIS — N184 Chronic kidney disease, stage 4 (severe): Secondary | ICD-10-CM | POA: Diagnosis not present

## 2024-08-17 DIAGNOSIS — R41841 Cognitive communication deficit: Secondary | ICD-10-CM | POA: Diagnosis not present

## 2024-08-28 ENCOUNTER — Encounter: Payer: Self-pay | Admitting: Podiatry

## 2024-08-28 ENCOUNTER — Ambulatory Visit: Admitting: Podiatry

## 2024-08-28 DIAGNOSIS — B353 Tinea pedis: Secondary | ICD-10-CM | POA: Diagnosis not present

## 2024-08-28 NOTE — Progress Notes (Unsigned)
       Chief Complaint  Patient presents with   Tinea Pedis    PT does not feel much improvement. Itching has lessened. Diabetic A1c 5.4 last.     HPI: 78 y.o. female presents today following up for interdigital tinea pedis.  She is accompanied by daughter.  Patient limited historian, history of CVA with some cognitive impairment..  Does have diabetes.  She does note that she has not noticed as much itching to the webspaces.  Has been using ketoconazole cream.  Past Medical History:  Diagnosis Date   Diabetes mellitus without complication (HCC)    Hyperlipidemia    Hypertension     History reviewed. No pertinent surgical history.  No Known Allergies  ROS    Physical Exam: There were no vitals filed for this visit.  General: The patient is alert and oriented x3 in no acute distress.  Dermatology: Improving dry xerotic pedal skin.  Decreased maceration to the webspaces.  There is some dry pedal skin to the fifth toes bilaterally.  Pedal skin atrophic  Vascular: Faintly will DP and PT pulses bilaterally.  Cap refill 3 to 5 seconds to the digits.  Sparse pedal hair growth.  Neurological:  Patient has difficulty participating in neurologic examination  Musculoskeletal Exam: No symptomatic pedal deformities or symptomatic pedal range of motion.   Assessment/Plan of Care: 1. Tinea pedis of both feet      No orders of the defined types were placed in this encounter.  None  Discussed clinical findings with patient today.  Overall appears to be improved from previous.  Cleansed webspaces with alcohol wipes today.  Advise close monitoring.  Monitor for excessive moisture.  Can use over-the-counter Tinactin if spray or powder if there are any signs of recurrence.  Discussed the importance of pedal hygiene in the treatment and maintenance of tinea pedis.  Follow-up for next diabetic footcare.   Marlyn Rabine L. Lamount MAUL, AACFAS [facility]     [address] [place], [place] [address]                Office [phone]  Fax [phone]

## 2024-08-29 DIAGNOSIS — I69322 Dysarthria following cerebral infarction: Secondary | ICD-10-CM | POA: Diagnosis not present

## 2024-08-29 DIAGNOSIS — R41841 Cognitive communication deficit: Secondary | ICD-10-CM | POA: Diagnosis not present

## 2024-09-01 DIAGNOSIS — H40013 Open angle with borderline findings, low risk, bilateral: Secondary | ICD-10-CM | POA: Diagnosis not present

## 2024-09-01 DIAGNOSIS — H47233 Glaucomatous optic atrophy, bilateral: Secondary | ICD-10-CM | POA: Diagnosis not present

## 2024-09-05 NOTE — Telephone Encounter (Signed)
 PAP: Application for [place] has been submitted to [employer], via fax  PAP: Application for Farxiga  has been submitted to [employer] ([employer]), via fax

## 2024-09-12 NOTE — Telephone Encounter (Signed)
 PAP: Patient assistance application for Farxiga has been approved by [employer] Companies: [employer] from 10/19/2024 to 10/18/2025. Medication should be delivered to PAP Delivery: Home. For further shipping updates, please contact [employer] ([employer]) at [phone]. Patient ID is: [id number]

## 2024-10-23 ENCOUNTER — Ambulatory Visit: Admitting: Podiatry

## 2024-10-25 ENCOUNTER — Ambulatory Visit: Admitting: Podiatry

## 2024-11-28 ENCOUNTER — Ambulatory Visit: Admitting: Podiatry
